# Patient Record
Sex: Female | Born: 1975 | Race: Black or African American | Hispanic: No | Marital: Married | Smoking: Never smoker
Health system: Southern US, Community
[De-identification: ages and names within clinical notes are randomized; demographics above are authoritative.]

---

## 2003-05-31 ENCOUNTER — Encounter: Admission: RE | Admit: 2003-05-31 | Discharge: 2003-05-31 | Payer: Self-pay | Admitting: Family Medicine

## 2003-06-05 ENCOUNTER — Encounter: Admission: RE | Admit: 2003-06-05 | Discharge: 2003-06-05 | Payer: Self-pay | Admitting: Family Medicine

## 2003-06-19 ENCOUNTER — Encounter: Admission: RE | Admit: 2003-06-19 | Discharge: 2003-06-19 | Payer: Self-pay | Admitting: Sports Medicine

## 2004-01-17 ENCOUNTER — Encounter: Admission: RE | Admit: 2004-01-17 | Discharge: 2004-01-17 | Payer: Self-pay | Admitting: Family Medicine

## 2004-06-05 ENCOUNTER — Emergency Department (HOSPITAL_COMMUNITY): Admission: EM | Admit: 2004-06-05 | Discharge: 2004-06-05 | Payer: Self-pay | Admitting: Emergency Medicine

## 2004-09-15 ENCOUNTER — Ambulatory Visit: Payer: Self-pay | Admitting: Family Medicine

## 2004-09-15 ENCOUNTER — Other Ambulatory Visit: Admission: RE | Admit: 2004-09-15 | Discharge: 2004-09-15 | Payer: Self-pay | Admitting: Family Medicine

## 2004-10-13 ENCOUNTER — Ambulatory Visit: Payer: Self-pay | Admitting: Family Medicine

## 2004-12-31 ENCOUNTER — Ambulatory Visit: Payer: Self-pay | Admitting: Family Medicine

## 2005-04-06 ENCOUNTER — Ambulatory Visit: Payer: Self-pay | Admitting: Family Medicine

## 2005-11-18 ENCOUNTER — Ambulatory Visit: Payer: Self-pay | Admitting: Family Medicine

## 2005-11-20 ENCOUNTER — Ambulatory Visit: Payer: Self-pay | Admitting: Family Medicine

## 2005-12-07 ENCOUNTER — Ambulatory Visit: Payer: Self-pay | Admitting: Sports Medicine

## 2006-02-22 ENCOUNTER — Ambulatory Visit: Payer: Self-pay | Admitting: Family Medicine

## 2006-06-16 ENCOUNTER — Encounter (INDEPENDENT_AMBULATORY_CARE_PROVIDER_SITE_OTHER): Payer: Self-pay | Admitting: *Deleted

## 2006-06-16 LAB — CONVERTED CEMR LAB

## 2006-06-22 ENCOUNTER — Ambulatory Visit: Payer: Self-pay | Admitting: Family Medicine

## 2006-07-02 ENCOUNTER — Other Ambulatory Visit: Admission: RE | Admit: 2006-07-02 | Discharge: 2006-07-02 | Payer: Self-pay | Admitting: Family Medicine

## 2006-07-02 ENCOUNTER — Ambulatory Visit: Payer: Self-pay | Admitting: Family Medicine

## 2006-07-16 ENCOUNTER — Ambulatory Visit: Payer: Self-pay | Admitting: Family Medicine

## 2006-07-16 ENCOUNTER — Ambulatory Visit (HOSPITAL_COMMUNITY): Admission: RE | Admit: 2006-07-16 | Discharge: 2006-07-16 | Payer: Self-pay | Admitting: Internal Medicine

## 2006-07-29 ENCOUNTER — Ambulatory Visit: Payer: Self-pay | Admitting: Family Medicine

## 2006-08-13 ENCOUNTER — Ambulatory Visit: Payer: Self-pay | Admitting: Family Medicine

## 2006-09-09 ENCOUNTER — Ambulatory Visit: Payer: Self-pay | Admitting: Family Medicine

## 2006-10-12 ENCOUNTER — Ambulatory Visit: Payer: Self-pay | Admitting: Family Medicine

## 2006-10-28 ENCOUNTER — Ambulatory Visit: Payer: Self-pay | Admitting: Family Medicine

## 2006-11-17 ENCOUNTER — Ambulatory Visit: Payer: Self-pay | Admitting: Sports Medicine

## 2006-11-17 ENCOUNTER — Encounter (INDEPENDENT_AMBULATORY_CARE_PROVIDER_SITE_OTHER): Payer: Self-pay | Admitting: *Deleted

## 2006-11-17 LAB — CONVERTED CEMR LAB
ALT: 11 units/L (ref 0–35)
AST: 13 units/L (ref 0–37)
Albumin: 3.5 g/dL (ref 3.5–5.2)
CO2: 22 meq/L (ref 19–32)
Glucose, Bld: 74 mg/dL (ref 70–99)
Potassium: 4.1 meq/L (ref 3.5–5.3)
Uric Acid, Serum: 4.2 mg/dL (ref 2.4–7.0)

## 2006-11-19 ENCOUNTER — Encounter (INDEPENDENT_AMBULATORY_CARE_PROVIDER_SITE_OTHER): Payer: Self-pay | Admitting: *Deleted

## 2006-11-19 LAB — CONVERTED CEMR LAB: Creatinine 24 HR UR: 2249 mg/24hr — ABNORMAL HIGH (ref 700–1800)

## 2006-11-22 ENCOUNTER — Ambulatory Visit (HOSPITAL_COMMUNITY): Admission: RE | Admit: 2006-11-22 | Discharge: 2006-11-22 | Payer: Self-pay | Admitting: Obstetrics and Gynecology

## 2006-11-23 ENCOUNTER — Ambulatory Visit: Payer: Self-pay | Admitting: Family Medicine

## 2006-11-29 ENCOUNTER — Encounter (INDEPENDENT_AMBULATORY_CARE_PROVIDER_SITE_OTHER): Payer: Self-pay | Admitting: *Deleted

## 2006-11-29 ENCOUNTER — Ambulatory Visit: Payer: Self-pay | Admitting: Family Medicine

## 2006-12-03 ENCOUNTER — Ambulatory Visit: Payer: Self-pay | Admitting: Family Medicine

## 2006-12-06 ENCOUNTER — Ambulatory Visit: Payer: Self-pay | Admitting: Obstetrics & Gynecology

## 2006-12-07 ENCOUNTER — Ambulatory Visit: Payer: Self-pay | Admitting: Obstetrics and Gynecology

## 2006-12-07 ENCOUNTER — Inpatient Hospital Stay (HOSPITAL_COMMUNITY): Admission: AD | Admit: 2006-12-07 | Discharge: 2006-12-07 | Payer: Self-pay | Admitting: *Deleted

## 2006-12-09 ENCOUNTER — Ambulatory Visit: Payer: Self-pay | Admitting: Obstetrics & Gynecology

## 2006-12-09 ENCOUNTER — Ambulatory Visit: Payer: Self-pay | Admitting: Family Medicine

## 2006-12-13 ENCOUNTER — Ambulatory Visit: Payer: Self-pay | Admitting: Obstetrics & Gynecology

## 2006-12-14 ENCOUNTER — Inpatient Hospital Stay (HOSPITAL_COMMUNITY): Admission: AD | Admit: 2006-12-14 | Discharge: 2006-12-17 | Payer: Self-pay | Admitting: Family Medicine

## 2006-12-14 ENCOUNTER — Ambulatory Visit: Payer: Self-pay | Admitting: *Deleted

## 2006-12-15 ENCOUNTER — Encounter (INDEPENDENT_AMBULATORY_CARE_PROVIDER_SITE_OTHER): Payer: Self-pay | Admitting: *Deleted

## 2006-12-24 ENCOUNTER — Ambulatory Visit: Payer: Self-pay | Admitting: Family Medicine

## 2007-01-13 DIAGNOSIS — I10 Essential (primary) hypertension: Secondary | ICD-10-CM

## 2007-01-13 DIAGNOSIS — E669 Obesity, unspecified: Secondary | ICD-10-CM | POA: Insufficient documentation

## 2007-01-13 DIAGNOSIS — R011 Cardiac murmur, unspecified: Secondary | ICD-10-CM | POA: Insufficient documentation

## 2007-01-14 ENCOUNTER — Encounter (INDEPENDENT_AMBULATORY_CARE_PROVIDER_SITE_OTHER): Payer: Self-pay | Admitting: *Deleted

## 2007-01-26 ENCOUNTER — Telehealth: Payer: Self-pay | Admitting: *Deleted

## 2007-03-10 ENCOUNTER — Telehealth: Payer: Self-pay | Admitting: *Deleted

## 2007-07-11 ENCOUNTER — Encounter: Payer: Self-pay | Admitting: Family Medicine

## 2007-07-11 ENCOUNTER — Other Ambulatory Visit: Admission: RE | Admit: 2007-07-11 | Discharge: 2007-07-11 | Payer: Self-pay | Admitting: Family Medicine

## 2007-07-11 ENCOUNTER — Encounter (INDEPENDENT_AMBULATORY_CARE_PROVIDER_SITE_OTHER): Payer: Self-pay | Admitting: *Deleted

## 2007-07-11 ENCOUNTER — Ambulatory Visit: Payer: Self-pay | Admitting: Sports Medicine

## 2007-07-11 LAB — CONVERTED CEMR LAB: Pap Smear: NORMAL

## 2007-07-12 LAB — CONVERTED CEMR LAB
Cholesterol: 168 mg/dL (ref 0–200)
GC Probe Amp, Genital: NEGATIVE
LDL Cholesterol: 110 mg/dL — ABNORMAL HIGH (ref 0–99)
VLDL: 13 mg/dL (ref 0–40)

## 2007-07-15 ENCOUNTER — Encounter (INDEPENDENT_AMBULATORY_CARE_PROVIDER_SITE_OTHER): Payer: Self-pay | Admitting: *Deleted

## 2007-09-01 ENCOUNTER — Ambulatory Visit: Payer: Self-pay | Admitting: Internal Medicine

## 2007-09-01 ENCOUNTER — Telehealth (INDEPENDENT_AMBULATORY_CARE_PROVIDER_SITE_OTHER): Payer: Self-pay | Admitting: *Deleted

## 2007-09-01 ENCOUNTER — Encounter (INDEPENDENT_AMBULATORY_CARE_PROVIDER_SITE_OTHER): Payer: Self-pay | Admitting: *Deleted

## 2008-04-23 ENCOUNTER — Encounter: Payer: Self-pay | Admitting: Family Medicine

## 2008-07-02 ENCOUNTER — Ambulatory Visit: Payer: Self-pay | Admitting: Family Medicine

## 2008-07-03 ENCOUNTER — Encounter: Payer: Self-pay | Admitting: Family Medicine

## 2008-07-05 ENCOUNTER — Telehealth (INDEPENDENT_AMBULATORY_CARE_PROVIDER_SITE_OTHER): Payer: Self-pay | Admitting: Family Medicine

## 2008-07-05 ENCOUNTER — Encounter (INDEPENDENT_AMBULATORY_CARE_PROVIDER_SITE_OTHER): Payer: Self-pay | Admitting: *Deleted

## 2009-05-28 ENCOUNTER — Encounter: Payer: Self-pay | Admitting: Family Medicine

## 2009-10-25 ENCOUNTER — Encounter: Payer: Self-pay | Admitting: Family Medicine

## 2009-10-25 ENCOUNTER — Ambulatory Visit: Payer: Self-pay | Admitting: Family Medicine

## 2009-10-25 ENCOUNTER — Other Ambulatory Visit: Admission: RE | Admit: 2009-10-25 | Discharge: 2009-10-25 | Payer: Self-pay | Admitting: Family Medicine

## 2009-11-04 ENCOUNTER — Encounter: Payer: Self-pay | Admitting: Family Medicine

## 2009-12-05 ENCOUNTER — Ambulatory Visit: Payer: Self-pay | Admitting: Family Medicine

## 2009-12-05 ENCOUNTER — Encounter: Payer: Self-pay | Admitting: Family Medicine

## 2009-12-05 DIAGNOSIS — R8789 Other abnormal findings in specimens from female genital organs: Secondary | ICD-10-CM

## 2009-12-16 ENCOUNTER — Encounter: Payer: Self-pay | Admitting: Family Medicine

## 2009-12-16 LAB — CONVERTED CEMR LAB

## 2010-08-26 ENCOUNTER — Ambulatory Visit: Payer: Self-pay | Admitting: Family Medicine

## 2010-09-01 ENCOUNTER — Telehealth: Payer: Self-pay | Admitting: Family Medicine

## 2010-09-02 ENCOUNTER — Encounter: Payer: Self-pay | Admitting: Family Medicine

## 2010-10-17 ENCOUNTER — Ambulatory Visit: Payer: Self-pay | Admitting: Family Medicine

## 2010-12-15 ENCOUNTER — Ambulatory Visit
Admission: RE | Admit: 2010-12-15 | Discharge: 2010-12-15 | Payer: Self-pay | Source: Home / Self Care | Attending: Family Medicine | Admitting: Family Medicine

## 2010-12-15 DIAGNOSIS — F438 Other reactions to severe stress: Secondary | ICD-10-CM | POA: Insufficient documentation

## 2010-12-16 NOTE — Letter (Signed)
Summary: COLPOSCOPY follow up: Letter  [facility]  [address]   [place], [place] [address]   Phone: [phone]  Fax: [phone]    12/16/2009  [facility] [address] [place], [place]  [address]  Dear Ms. GIBBS,  The extra testing we did at your colposcopy confirmed the low grade changes we saw on your pap. 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 This infection can pre-dispose you to abnormal pa smears and makes [place] want to follow you more closely.  Your colposcopy itself showed no lesions so as we had discussed, I would recommend you see [place] for a repeat pap smear in 6 months. We will probably be following you every 6 months for two years. If the abnormalities persist for two years, we would recommend a referral to a gynecologist for possible LEEP procedure.  If you have any questions, please call.   Your pap smear can be done here by your regular provider or in [facility] by me.         Sincerely,   Denny Levy MD  Appended Document: COLPOSCOPY follow up: Letter mailed letter to pt

## 2010-12-16 NOTE — Letter (Signed)
Summary: Generic Letter  [facility]  [address]   [place], [place] [address]   Phone: [phone]  Fax: [phone]    09/02/2010  Michelle Kline [address] [place], [place]  [address]  Dear [name],   Your PAP is still abnormal, it is a low grade abnormality.  Dr. Jennette Kettle recommended that you have PAP smears every 6 months until they are normal.  Anytime you have an infection that will affect the cells, so the Trich could be a factor; along with the others already discussed.  Please return in 6 months for repeat PAP and STD testing.  Your partner will need to be treated, the refrain form sex for one week after you both are treated.        Sincerely,   Luretha Murphy NP  Appended Document: Generic Letter mailed

## 2010-12-16 NOTE — Miscellaneous (Signed)
Summary: Consent Colposcopy  Consent Colposcopy   Imported By: Clydell Hakim 12/10/2009 16:20:10  _____________________________________________________________________  External Attachment:    Type:   Image     Comment:   External Document

## 2010-12-16 NOTE — Progress Notes (Signed)
Summary: re: pap/ts  Phone Note Outgoing Call   Call placed by: Haydee Monica Call placed to: Patient Details for Reason: results of PAP and treatment of trich. Summary of Call: Patient to return call Initial call taken by: Luretha Murphy NP,  September 01, 2010 11:51 AM  Follow-up for Phone Call        called pt and informed of Trich found on pap smear. pt NKDA.  pharmacy is [facility] on [address]. told the pt, that S.Humberto Seals would send meds. pt agreed and said, that she had Trich before. fwd. to [facility] Follow-up by: Arlyss Repress CMA,,  September 02, 2010 8:45 AM  Additional Follow-up for Phone Call Additional follow up Details #1::        letter sent for repeat pap in 6 months and std treatment discussed by Ramond Dial with patient. Additional Follow-up by: Luretha Murphy NP,  September 02, 2010 11:50 AM    New/Updated Medications: METRONIDAZOLE 500 MG TABS (METRONIDAZOLE) 4 pills once, please counsel on alcohol affect Prescriptions: METRONIDAZOLE 500 MG TABS (METRONIDAZOLE) 4 pills once, please counsel on alcohol affect  #4 x 0   Entered and Authorized by:   Luretha Murphy NP   Signed by:   Luretha Murphy NP on 09/02/2010   Method used:   Electronically to        [facility] (retail)       [address]       [place], [place]  [id number]       Ph: [phone]       Fax: [phone]   RxID:   [id number]

## 2010-12-16 NOTE — Assessment & Plan Note (Signed)
Summary: repeat pap,df   Vital Signs:  Patient profile:   36 year old female Height:      66.75 inches Weight:      210 pounds BMI:     33.26 Temp:     97.7 degrees F oral Pulse rate:   90 / minute BP sitting:   136 / 84  (left arm) Cuff size:   large  Vitals Entered By: Tessie Fass CMA (August 26, 2010 9:39 AM) CC: repeat pap Is Patient Diabetic? No Pain Assessment Patient in pain? no        Primary Care Provider:  Lequita Asal  MD  CC:  repeat pap.  History of Present Illness: Here for repeat PAP after culpo over 6 months ago, patient has no complaints  Habits & Providers  Alcohol-Tobacco-Diet     Tobacco Status: never  Current Medications (verified): 1)  None  Allergies (verified): No Known Drug Allergies  Physical Exam  General:  Well-developed,well-nourished,in no acute distress; alert,appropriate and cooperative throughout examination Genitalia:  Normal introitus for age, no external lesions, no vaginal discharge, mucosa pink and moist, no vaginal or cervical lesions, no vaginal atrophy, no friaility or hemorrhage, normal uterus size and position, no adnexal masses or tenderness   Impression & Recommendations:  Problem # 1:  SCREENING FOR MALIGNANT NEOPLASM OF THE CERVIX (ICD-V76.2) PAP today, if abdnormal will refer   Problem # 2:  ABNORMAL PAP SMEAR, 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

## 2010-12-16 NOTE — Assessment & Plan Note (Signed)
Summary: flu shot,df  Nurse Visit  Vital Signs:  Patient profile:   35 year old female Temp:     97.8 degrees F  Vitals Entered By: Theresia Lo RN (October 17, 2010 11:11 AM)  Allergies: No Known Drug Allergies  Immunizations Administered:  Influenza Vaccine # 1:    Vaccine Type: Fluvax 3+    Site: right deltoid    Mfr: GlaxoSmithKline    Dose: 0.5 ml    Route: IM    Given by: Theresia Lo RN    Exp. Date: 05/16/2011    Lot #: [id number]    VIS given: 06/10/10 version given October 17, 2010.  Flu Vaccine Consent Questions:    Do you have a history of severe allergic reactions to this vaccine? no    Any prior history of allergic reactions to egg and/or gelatin? no    Do you have a sensitivity to the preservative Thimersol? no    Do you have a past history of Guillan-Barre Syndrome? no    Do you currently have an acute febrile illness? no    Have you ever had a severe reaction to latex? no    Vaccine information given and explained to patient? yes    Are you currently pregnant? no  Orders Added: 1)  Flu Vaccine 36yrs + [[address]] 2)  Admin 1st Vaccine [[address]782]

## 2010-12-16 NOTE — Assessment & Plan Note (Signed)
Summary: colpo,tcb   Vital Signs:  Patient profile:   35 year old female Height:      66.75 inches Weight:      248.1 pounds BMI:     39.29 Temp:     98.1 degrees F oral Pulse rate:   75 / minute BP sitting:   144 / 83 Cuff size:   large  Vitals Entered By: Gladstone Pih (December 05, 2009 10:40 AM) CC: Colpo Is Patient Diabetic? No Pain Assessment Patient in pain? no        Primary Care Provider:  Lequita Asal  MD  CC:  Colpo.  History of Present Illness: LGSIL on pap  10/25/09,   Habits & Providers  Alcohol-Tobacco-Diet     Tobacco Status: never  Allergies: No Known Drug Allergies  Physical Exam  Additional Exam:  Patient given informed consent, signed copy in the chart. Placed in lithotomy position. Cervix viewed with speculum and colposcope. Was the entire squamocolumnar junction seen?with use of endocervical speculum the entie SCJ was seen. Any acetowhite lesions noted?no Any abnormalities seen with green filter? Any abnormalities seen with application of Lugol's solution?not done Was the endocervical canal sampled?yes as additional pap smear for HPV Were any cervical biopsies taken?no Were there any complications?no COMMENTS:vaginal side wall retractirs used. WILL SEND EPEAT PAP FOR hpv IF any ABNL Patient was given post procedure instructions. We will notify her of any results.    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 You had an abnormal pap but the colposcop was normal. In your age group and with he results of the initial pap I would recommend a repeat pap in 6 months. 2)  Please schedule that at your convenience with either Dr Lanier Prude or me (Dr Jennette Kettle).

## 2010-12-18 ENCOUNTER — Telehealth: Payer: Self-pay | Admitting: Family Medicine

## 2010-12-22 ENCOUNTER — Telehealth: Payer: Self-pay | Admitting: *Deleted

## 2010-12-24 NOTE — Progress Notes (Signed)
Summary: phn msg  Phone Note Call from Patient Call back at [phone]   Caller: Patient Summary of Call: needs complete paperwork asap - she is jeapardy of losing her job - all of it needs to be completely Initial call taken by: De Nurse,  December 18, 2010 3:13 PM  Follow-up for Phone Call        I completed all the paperwork, it was in the fax cubby hole.  Thank you Follow-up by: Edd Arbour,  December 18, 2010 3:40 PM

## 2010-12-24 NOTE — Assessment & Plan Note (Signed)
Summary: anxiety/depression/eo   Vital Signs:  Patient profile:   35 year old female Height:      66.75 inches Weight:      220 pounds Temp:     99 degrees F oral Pulse rate:   80 / minute Pulse rhythm:   regular BP sitting:   159 / 90  (left arm) Cuff size:   large  Vitals Entered By: Loralee Pacas CMA (December 15, 2010 3:31 PM) CC: anxiety/depression   Primary Provider:  Lequita Asal  MD  CC:  anxiety/depression.  History of Present Illness: 1. Depression/anxiety  patient has a PHQ9 score of 21. she has been experiencing lack of interest, fatigue, weight gain, crying spells. she is normally the rock of her family, but lately she is having trouble keeping up. She is seen by a psychotherapist who recommended some leave from work to try lifestyle modification like excercise and relaxation to try and combat this new mood change. She is working more now and has no one to help her with her children at home, which is a major stressor for her. She is not on any medications that would cause depression. She has no signs/symptoms of hypothyroidism. She has no suicidal/homicidal thoughts.  Habits & Providers  Alcohol-Tobacco-Diet     Tobacco Status: never     Tobacco Counseling: not indicated; no tobacco use  Allergies: No Known Drug Allergies  Review of Systems       see HPI  Physical Exam  Psych:  Oriented X3, normally interactive, good eye contact, flat affect, subdued, withdrawn, and slightly anxious.     Impression & Recommendations:  Problem # 1:  DYSPHORIA (ICD-309.89) Assessment New  I believe her symptoms are related to real life stressors and less of an organic depressive disorder. I will perscribe a two week hiatus from work for her to get her life in order. She will see me again if she is still feeling depressed. She will also follow up with her psychotherapist.  Orders: [facility]- Est Level  3 ([address])  Complete Medication List: 1)  Metronidazole 500  Mg Tabs (Metronidazole) .... 4 pills once, please counsel on alcohol affect   Orders Added: 1)  [facility]- Est Level  3 [[address]]

## 2010-12-25 ENCOUNTER — Encounter: Payer: Self-pay | Admitting: *Deleted

## 2011-01-01 NOTE — Progress Notes (Signed)
Summary: Records  Phone Note Call from Patient Call back at [phone]   Summary of Call: pt sts some of her records were faxed over from a different office, said Archie Patten had them the last time she was here, pt is needing a copy of these.  Initial call taken by: Knox Royalty,  December 22, 2010 9:39 AM  Follow-up for Phone Call        lvm for pt to return call Follow-up by: Loralee Pacas CMA,  December 22, 2010 10:16 AM  Additional Follow-up for Phone Call Additional follow up Details #1::        spoke with pt and told her that dr. Rivka Safer has this paperwork and i am unsure whether or not he has it in his box pt expressed to me that she has to fax this along with other information to her ins co. for disability claim or she could lose her job. will flag dr. Rivka Safer  Additional Follow-up by: Loralee Pacas CMA,  December 22, 2010 10:19 AM    Additional Follow-up for Phone Call Additional follow up Details #2::    information gathered for pt she will come by and pick up Follow-up by: Loralee Pacas CMA,  December 22, 2010 3:32 PM

## 2011-01-08 ENCOUNTER — Encounter: Payer: Self-pay | Admitting: Family Medicine

## 2011-03-06 ENCOUNTER — Ambulatory Visit: Payer: Self-pay | Admitting: Family Medicine

## 2011-04-03 NOTE — Op Note (Signed)
Michelle Kline, Michelle Kline                 ACCOUNT NO.:  [id number]   MEDICAL RECORD NO.:  [id number]          PATIENT TYPE:  INP   LOCATION:  9120                          FACILITY:  [facility]   PHYSICIAN:  Ginger Carne, MD  DATE OF BIRTH:  1976-11-12   DATE OF PROCEDURE:  12/15/2006  DATE OF DISCHARGE:                               OPERATIVE REPORT   PREOPERATIVE DIAGNOSIS:  Desires permanent sterilization.   POSTOPERATIVE DIAGNOSIS:  Desires permanent sterilization.   PROCEDURE:  Postpartum tubal ligation via Pomeroy method.   SURGEON:  Ginger Carne, MD   ASSISTANT:  Paticia Stack, MD   ANESTHESIA:  Epidural and local.   COMPLICATIONS:  None.   ESTIMATED BLOOD LOSS:  Minimal.   INDICATIONS:  This is a 35 year old gravida 4, para 3-0-1-3, status post  spontaneous vaginal delivery, who desires permanent sterilization.  The  risks and benefits of the procedure were discussed with the patient,  including risk of failure and increased risk of ectopic gestation if  pregnancy occurs.   PROCEDURE:  The patient was taken to the operating room, where her  epidural was found to be adequate.  A small vertical infraumbilical skin  incision was then made with a scalpel and the incision was carried down  to the underlying layer of fascia until the peritoneum was identified  and entered.  The peritoneum was noted to be free of any adhesions and  the incision was extended with the Metzenbaum scissors.  The patient's  right fallopian tube was then identified, brought to the incision, and  grasped with a Babcock clamp.  The tube was followed out to its fimbriae  and isolated from its individual round ligament.  The Babcock clamp was  then used to grasp the tube approximately 4 cm from the cornual region.  A 3 cm segment of tube was then ligated with a free tie of plain gut and  excised.  The ends were electrocauterized with excellent hemostasis  noted.  The left fallopian tube was then  ligated and a 3 cm segment  excised in a similar fashion.  Excellent hemostasis was noted and the  tube returned to the abdomen.  The fascia was then closed in a single  layer using 3-0 Vicryl.  The skin was closed in a subcuticular  fashion with 3-0 Monocryl on a PS-2 needle.  The patient tolerated the  procedure well.  Sponge, lap and needle counts were correct x2.  The  patient was taken to the recovery room in stable condition.   PATHOLOGY:  Segments of the right and left fallopian tubes were sent to  pathology.     ______________________________  Paticia Stack, MD      Ginger Carne, MD  Electronically Signed    LNJ/MEDQ  D:  12/15/2006  T:  12/15/2006  Job:  [id number]

## 2011-04-06 ENCOUNTER — Ambulatory Visit: Payer: Self-pay | Admitting: Family Medicine

## 2012-01-07 ENCOUNTER — Ambulatory Visit: Payer: Self-pay | Admitting: Family Medicine

## 2012-01-12 ENCOUNTER — Other Ambulatory Visit (HOSPITAL_COMMUNITY)
Admission: RE | Admit: 2012-01-12 | Discharge: 2012-01-12 | Disposition: A | Discharge status: Home / Self Care | Payer: Medicare HMO | Source: Ambulatory Visit | Attending: Family Medicine | Admitting: Family Medicine

## 2012-01-12 ENCOUNTER — Ambulatory Visit (INDEPENDENT_AMBULATORY_CARE_PROVIDER_SITE_OTHER): Payer: Medicare HMO | Admitting: Family Medicine

## 2012-01-12 ENCOUNTER — Encounter: Payer: Self-pay | Admitting: Family Medicine

## 2012-01-12 VITALS — BP 148/90 | HR 76 | Temp 97.9°F | Ht 66.0 in | Wt 234.8 lb

## 2012-01-12 DIAGNOSIS — R8789 Other abnormal findings in specimens from female genital organs: Secondary | ICD-10-CM

## 2012-01-12 DIAGNOSIS — R030 Elevated blood-pressure reading, without diagnosis of hypertension: Secondary | ICD-10-CM

## 2012-01-12 DIAGNOSIS — B977 Papillomavirus as the cause of diseases classified elsewhere: Secondary | ICD-10-CM

## 2012-01-12 DIAGNOSIS — Z124 Encounter for screening for malignant neoplasm of cervix: Secondary | ICD-10-CM

## 2012-01-12 DIAGNOSIS — Z01419 Encounter for gynecological examination (general) (routine) without abnormal findings: Secondary | ICD-10-CM | POA: Insufficient documentation

## 2012-01-12 NOTE — Patient Instructions (Signed)
It was great to see you today!  Schedule an appointment to see me for your yearly physical.

## 2012-01-12 NOTE — Progress Notes (Signed)
  Subjective:    Patient ID: Michelle Kline, female    DOB: May 12, 1976, 36 y.o.   MRN: [id number]  HPI 1. Pap smear performed. - HX of irregular PAP results. Denies bleeding, discharge, pain itching. She is sexually active.  2. Mild HTN No dm. No symptoms. No BP meds. BP Readings from Last 3 Encounters:  01/12/12 148/90  12/15/10 159/90  08/26/10 136/84    Review of Systems Pertinent items are noted in HPI. No fever, chills, night sweats, weight loss.     Objective:   Physical Exam Filed Vitals:   01/12/12 1113  BP: 148/90  Pulse: 76  Temp: 97.9 F (36.6 C)  TempSrc: Oral  Height: 5\' 6"  (1.676 m)  Weight: 234 lb 12.8 oz (106.505 kg)  Vagina: white thin discharge (normal appearing) no foul smell. External genitalia normal. Cervix grossly normal appearing. Heart - Regular rate and rhythm.  No murmurs, gallops or rubs.    Lungs:  Normal respiratory effort, chest expands symmetrically. Lungs are clear to auscultation, no crackles or wheezes.     Assessment & Plan:

## 2012-01-12 NOTE — Assessment & Plan Note (Signed)
BP Readings from Last 3 Encounters:  01/12/12 148/90  12/15/10 159/90  08/26/10 136/84  advised lifestyle changes. Diet and exercise. No DM hx.  Will follow up her BP.

## 2012-01-12 NOTE — Assessment & Plan Note (Signed)
Here for repeat PAP.

## 2012-02-02 ENCOUNTER — Telehealth: Payer: Self-pay | Admitting: Family Medicine

## 2012-02-02 NOTE — Telephone Encounter (Signed)
Hasn't heard anything about results of pap.  pls advise.

## 2012-02-02 NOTE — Telephone Encounter (Signed)
Called and informed pt of results:  Diagnosis NEGATIVE FOR INTRAEPITHELIAL LESIONS OR MALIGNANCY.  Pt to rtc in 1 year. Laureen Ochs, Viann Shove

## 2013-01-27 ENCOUNTER — Encounter (HOSPITAL_COMMUNITY): Payer: Self-pay | Admitting: Adult Health

## 2013-01-27 ENCOUNTER — Emergency Department (HOSPITAL_COMMUNITY): Payer: Self-pay

## 2013-01-27 ENCOUNTER — Emergency Department (HOSPITAL_COMMUNITY)
Admission: EM | Admit: 2013-01-27 | Discharge: 2013-01-27 | Disposition: A | Discharge status: Home / Self Care | Payer: Self-pay | Attending: Emergency Medicine | Admitting: Emergency Medicine

## 2013-01-27 DIAGNOSIS — J3489 Other specified disorders of nose and nasal sinuses: Secondary | ICD-10-CM | POA: Insufficient documentation

## 2013-01-27 DIAGNOSIS — J189 Pneumonia, unspecified organism: Secondary | ICD-10-CM

## 2013-01-27 DIAGNOSIS — R071 Chest pain on breathing: Secondary | ICD-10-CM | POA: Insufficient documentation

## 2013-01-27 DIAGNOSIS — J159 Unspecified bacterial pneumonia: Secondary | ICD-10-CM | POA: Insufficient documentation

## 2013-01-27 MED ORDER — HYDROCOD POLST-CHLORPHEN POLST 10-8 MG/5ML PO LQCR
5.0000 mL | Freq: Once | ORAL | Status: AC
  Administered 2013-01-27: 5 mL via ORAL
  Filled 2013-01-27: qty 5

## 2013-01-27 MED ORDER — AZITHROMYCIN 250 MG PO TABS
500.0000 mg | ORAL_TABLET | Freq: Once | ORAL | Status: AC
  Administered 2013-01-27: 500 mg via ORAL
  Filled 2013-01-27: qty 2

## 2013-01-27 MED ORDER — HYDROCOD POLST-CHLORPHEN POLST 10-8 MG/5ML PO LQCR: 5.0000 mL | Freq: Two times a day (BID) | ORAL | Status: DC | PRN

## 2013-01-27 MED ORDER — BENZONATATE 200 MG PO CAPS: 200.0000 mg | ORAL_CAPSULE | Freq: Three times a day (TID) | ORAL | Status: DC | PRN

## 2013-01-27 MED ORDER — BENZONATATE 100 MG PO CAPS
200.0000 mg | ORAL_CAPSULE | Freq: Three times a day (TID) | ORAL | Status: DC | PRN
  Administered 2013-01-27: 200 mg via ORAL
  Filled 2013-01-27: qty 2

## 2013-01-27 MED ORDER — AZITHROMYCIN 250 MG PO TABS: ORAL_TABLET | ORAL | Status: DC

## 2013-01-27 NOTE — ED Provider Notes (Signed)
History     CSN: [id number]  Arrival date & time 01/27/13  0029   First MD Initiated Contact with Patient 01/27/13 0044      Chief Complaint  Patient presents with  . Cough    (Consider location/radiation/quality/duration/timing/severity/associated sxs/prior treatment) HPI 37 yo female presents to the ER with complaint of cough x 10 days with chest wall pain associated with the coughing.  No fevers, no chills.  Pt reports cough is productive of yellow/brown sputum.  Pt is a nonsmoker, no h/o asthma.  Pt tried otc med for congestion x1 without effect.  History reviewed. No pertinent past medical history.  History reviewed. No pertinent past surgical history.  History reviewed. No pertinent family history.  History  Substance Use Topics  . Smoking status: Never Smoker   . Smokeless tobacco: Not on file  . Alcohol Use: No    OB History   Grav Para Term Preterm Abortions TAB SAB Ect Mult Living                  Review of Systems  All other systems reviewed and are negative.    Allergies  Review of patient's allergies indicates no known allergies.  Home Medications   Current Outpatient Rx  Name  Route  Sig  Dispense  Refill  . ibuprofen (ADVIL,MOTRIN) 200 MG tablet   Oral   Take 200 mg by mouth every 6 (six) hours as needed for pain.         Marland Kitchen azithromycin (ZITHROMAX) 250 MG tablet      Take one tab each evening for 4 days   4 each   0   . benzonatate (TESSALON) 200 MG capsule   Oral   Take 1 capsule (200 mg total) by mouth 3 (three) times daily as needed for cough.   20 capsule   0   . chlorpheniramine-HYDROcodone (TUSSIONEX) 10-8 MG/5ML LQCR   Oral   Take 5 mLs by mouth every 12 (twelve) hours as needed (cough).   140 mL   0     BP 187/99  Temp(Src) 98.2 F (36.8 C) (Oral)  Resp 18  SpO2 98%  LMP 01/25/2013  Physical Exam  Nursing note and vitals reviewed. Constitutional: She is oriented to person, place, and time. She appears  well-developed and well-nourished.  HENT:  Head: Normocephalic and atraumatic.  Nose: Nose normal.  Mouth/Throat: Oropharynx is clear and moist.  Eyes: Conjunctivae and EOM are normal. Pupils are equal, round, and reactive to light.  Neck: Normal range of motion. Neck supple. No JVD present. No tracheal deviation present. No thyromegaly present.  Cardiovascular: Normal rate, regular rhythm, normal heart sounds and intact distal pulses.  Exam reveals no gallop and no friction rub.   No murmur heard. Pulmonary/Chest: Effort normal and breath sounds normal. No stridor. No respiratory distress. She has no wheezes. She has no rales. She exhibits no tenderness.  Abdominal: Soft. Bowel sounds are normal. She exhibits no distension and no mass. There is no tenderness. There is no rebound and no guarding.  Musculoskeletal: Normal range of motion. She exhibits no edema and no tenderness.  Lymphadenopathy:    She has no cervical adenopathy.  Neurological: She is alert and oriented to person, place, and time. She exhibits normal muscle tone. Coordination normal.  Skin: Skin is warm and dry. No rash noted. No erythema. No pallor.  Psychiatric: She has a normal mood and affect. Her behavior is normal. Judgment and thought content normal.  ED Course  Procedures (including critical care time)  Labs Reviewed - No data to display Dg Chest 2 View  01/27/2013  *RADIOLOGY REPORT*  Clinical Data: Productive cough for 1 week, congestion, cold symptoms  CHEST - 2 VIEW  Comparison: None  Findings: Upper normal heart size. Mediastinal contours and pulmonary vascularity normal. Right lower lobe infiltrate in anterior basal segment consistent with pneumonia. Remaining lungs clear. No pleural effusion or pneumothorax. Bones unremarkable.  IMPRESSION: Right lower lobe pneumonia.   Original Report Authenticated By: Ulyses Southward, M.D.      1. CAP (community acquired pneumonia)       MDM  37 yo female with  infiltrate on cxr.  Will treat with zithro, cough medications.        Olivia Mackie, MD 01/27/13 [id number]

## 2013-01-27 NOTE — ED Notes (Addendum)
Presents with 10 days of cough reports pain in chest when coughing, cough is productive, brownish yellowish in color and associated with runny nose.  Denies SOB. Bilateral breath sounds clear. Denies fevers.  Pt denies pain.  Pt is hypertensive 187/99.

## 2013-02-02 ENCOUNTER — Ambulatory Visit (INDEPENDENT_AMBULATORY_CARE_PROVIDER_SITE_OTHER): Payer: Self-pay | Admitting: Family Medicine

## 2013-02-02 ENCOUNTER — Encounter: Payer: Self-pay | Admitting: Family Medicine

## 2013-02-02 VITALS — BP 156/83 | HR 93 | Temp 98.0°F | Ht 66.0 in | Wt 239.2 lb

## 2013-02-02 DIAGNOSIS — J189 Pneumonia, unspecified organism: Secondary | ICD-10-CM

## 2013-02-02 DIAGNOSIS — R030 Elevated blood-pressure reading, without diagnosis of hypertension: Secondary | ICD-10-CM

## 2013-02-02 NOTE — Progress Notes (Signed)
  Subjective:    Patient ID: Michelle Kline, female    DOB: 21-Nov-1975, 37 y.o.   MRN: [id number]  HPI  1. followup pneumonia. Patient seen in the emergency department March 14, diagnosed with [facility] pneumonia after 10 days of cough and fatigue. X-ray findings showed right lower lobe pneumonia. She denies a white count or systemic illness. She completed azithromycin treatment. States she feels much improved with only a rare cough. There was a sick coworker she thinks expose her to the illness.  She denies any fever, shortness of breath, wheezing, malaise, rash, chest pain, fatigue. She is exposed to secondhand smoke sometimes. Denies history of pneumonia or asthma. Did not get flu shot this year.  2. elevated blood pressure. Patient has documented blood pressure elevation several times over the past year. She is not formally diagnosed with hypertension or receiving treatment. She has not followed up this due to lack of insurance, though she recently filed for this through her job. She denies any headache, edema, visual changes. No family history, alcohol, drug use, decongestant/recent NSAID use.  Review of Systems See HPI otherwise negative.  reports that she has never smoked. She does not have any smokeless tobacco history on file.     Objective:   Physical Exam  Vitals reviewed. Constitutional: She is oriented to person, place, and time. She appears well-developed and well-nourished. No distress.  HENT:  Head: Normocephalic and atraumatic.  Nose: Nose normal.  Mouth/Throat: Oropharynx is clear and moist. No oropharyngeal exudate.  Slight hoarseness noted.  Neck: Neck supple. No thyromegaly present.  Cardiovascular: Normal rate, regular rhythm and normal heart sounds.   No murmur heard. Pulmonary/Chest: Effort normal and breath sounds normal. No respiratory distress. She has no wheezes. She has no rales.  Musculoskeletal: She exhibits no edema and no tenderness.  Lymphadenopathy:   She has no cervical adenopathy.  Neurological: She is alert and oriented to person, place, and time.  Skin: She is not diaphoretic.  Psychiatric: She has a normal mood and affect.       Assessment & Plan:

## 2013-02-02 NOTE — Patient Instructions (Addendum)
Nice to meet you. If you have fevers, shortness of breath or worsening, then call the doctor for appointment. Make an appointment with Dr. Claiborne Billings in next 2-4 weeks to discuss high blood pressure.   Pneumonia, Adult Pneumonia is an infection of the lungs. It may be caused by a germ (virus or bacteria). Some types of pneumonia can spread easily from person to person. This can happen when you cough or sneeze. HOME CARE  Only take medicine as told by your doctor.  Take your medicine (antibiotics) as told. Finish it even if you start to feel better.  Do not smoke.  You may use a vaporizer or humidifier in your room. This can help loosen thick spit (mucus).  Sleep so you are almost sitting up (semi-upright). This helps reduce coughing.  Rest. A shot (vaccine) can help prevent pneumonia. Shots are often advised for:  People over 28 years old.  Patients on chemotherapy.  People with long-term (chronic) lung problems.  People with immune system problems. GET HELP RIGHT AWAY IF:   You are getting worse.  You cannot control your cough, and you are losing sleep.  You cough up blood.  Your pain gets worse, even with medicine.  You have a fever.  Any of your problems are getting worse, not better.  You have shortness of breath or chest pain. MAKE SURE YOU:   Understand these instructions.  Will watch your condition.  Will get help right away if you are not doing well or get worse. Document Released: 04/20/2008 Document Revised: 01/25/2012 Document Reviewed: 01/23/2011 [facility] Patient Information 2013 [place], [place].     Hypertension Hypertension is another name for high blood pressure. High blood pressure may mean that your heart needs to work harder to pump blood. Blood pressure consists of two numbers, which includes a higher number over a lower number (example: 110/72). HOME CARE   Make lifestyle changes as told by your doctor. This may include weight loss and  exercise.  Take your blood pressure medicine every day.  Limit how much salt you use.  Stop smoking if you smoke.  Do not use drugs.  Talk to your doctor if you are using decongestants or birth control pills. These medicines might make blood pressure higher.  Females should not drink more than 1 alcoholic drink per day. Males should not drink more than 2 alcoholic drinks per day.  See your doctor as told. GET HELP RIGHT AWAY IF:   You have a blood pressure reading with a top number of 180 or higher.  You get a very bad headache.  You get blurred or changing vision.  You feel confused.  You feel weak, numb, or faint.  You get chest or belly (abdominal) pain.  You throw up (vomit).  You cannot breathe very well. MAKE SURE YOU:   Understand these instructions.  Will watch your condition.  Will get help right away if you are not doing well or get worse. Document Released: 04/20/2008 Document Revised: 01/25/2012 Document Reviewed: 04/20/2008 [facility] Patient Information 2013 [place], [place].

## 2013-02-02 NOTE — Assessment & Plan Note (Signed)
Clinically improved following azithromycin course. Will give flu shot today. May return to work. Advised patient to followup if symptoms return, fevers or shortness of breath. Advised avoidance of sake and smoke.

## 2013-02-02 NOTE — Assessment & Plan Note (Signed)
Leaning towards hypertension at this point. Advised her strongly to follow up with PCP to discuss treatment and further evaluation. Discussed risk factor of long-term untreated hypertension including renal, cardiac manifestations importance of followup she agrees to do so now that insurance is in place.

## 2013-02-06 ENCOUNTER — Telehealth: Payer: Self-pay | Admitting: Family Medicine

## 2013-02-06 NOTE — Telephone Encounter (Signed)
Patient is calling because her workplace need the letter to state that Michelle Kline is released to return to work, and it needs to have tomorrows date on it because she was sent home today because the letter she had didn't state that she was released.  Please call her when it is ready to be picked up.

## 2013-02-07 NOTE — Telephone Encounter (Signed)
Could you please give her the return to work excuse, with the proper dates? It appears she saw Noreene Larsson in the office.

## 2013-02-07 NOTE — Telephone Encounter (Signed)
Needs note to return to work tomorrow - not contagious

## 2013-02-07 NOTE — Telephone Encounter (Signed)
Left message on voicemail to rtn call,needing dates for letter . Michelle Kline, Michelle Kline

## 2013-02-07 NOTE — Telephone Encounter (Signed)
Pt informed that letter was placed upfront. Michelle Kline, Michelle Kline

## 2013-04-14 ENCOUNTER — Emergency Department (INDEPENDENT_AMBULATORY_CARE_PROVIDER_SITE_OTHER)
Admission: EM | Admit: 2013-04-14 | Discharge: 2013-04-14 | Disposition: A | Discharge status: Home / Self Care | Payer: 59 | Source: Home / Self Care | Attending: Emergency Medicine | Admitting: Emergency Medicine

## 2013-04-14 ENCOUNTER — Encounter (HOSPITAL_COMMUNITY): Payer: Self-pay | Admitting: Emergency Medicine

## 2013-04-14 DIAGNOSIS — H6123 Impacted cerumen, bilateral: Secondary | ICD-10-CM

## 2013-04-14 DIAGNOSIS — H6120 Impacted cerumen, unspecified ear: Secondary | ICD-10-CM

## 2013-04-14 DIAGNOSIS — H6092 Unspecified otitis externa, left ear: Secondary | ICD-10-CM

## 2013-04-14 MED ORDER — NEOMYCIN-POLYMYXIN-HC 3.5-10000-1 OT SUSP: 3.0000 [drp] | Freq: Four times a day (QID) | OTIC | Status: DC

## 2013-04-14 NOTE — ED Provider Notes (Signed)
Chief Complaint:   Chief Complaint  Patient presents with  . Cerumen Impaction    left ear ? clogged. "cant here out of left ear"    History of Present Illness:   Michelle Kline is a 37 year old female who has had pain in her left ear, sensation of congestion, and difficulty hearing. This has been going on for 3 or 4 days. She denies any pain or congestion of her right ear. She's had no fever, headache, nasal congestion, rhinorrhea, sore throat, or adenopathy. She denies a prior history of ear problems.  Review of Systems:  Other than noted above, the patient denies any of the following symptoms: Systemic:  No fevers, chills, sweats, weight loss or gain, fatigue, or tiredness. Eye:  No redness, pain, discharge, itching, blurred vision, or diplopia. ENT:  No headache, nasal congestion, sneezing, itching, epistaxis, ear pain, congestion, decreased hearing, ringing in ears, vertigo, or tinnitus.  No oral lesions, sore throat, pain on swallowing, or hoarseness. Neck:  No mass, tenderness or adenopathy. Lungs:  No coughing, wheezing, or shortness of breath. Skin:  No rash or itching.  PMFSH:  Past medical history, family history, social history, meds, and allergies were reviewed.   Physical Exam:   Vital signs:  BP 144/87  Pulse 71  Temp(Src) 97.3 F (36.3 C) (Oral)  Resp 18  SpO2 100%  LMP 03/17/2013 General:  Alert and oriented.  In no distress.  Skin warm and dry. Eye:  PERRL, full EOMs, lids and conjunctiva normal.   ENT:  There were cerumen impactions in both ear canals. These were irrigated clear. The right canal and TM are normal. The left canal was slightly erythematous but the TM was normal.  Nasal mucosa not congested and without drainage.  Mucous membranes moist, no oral lesions, normal dentition, pharynx clear.  No cranial or facial pain to palplation. Neck:  Supple, full ROM.  No adenopathy, tenderness or mass.  Thyroid normal. Lungs:  Breath sounds clear and equal bilaterally.   No wheezes, rales or rhonchi. Heart:  Rhythm regular, without extrasystoles.  No gallops or murmers. Skin:  Clear, warm and dry.  Assessment:  The primary encounter diagnosis was Cerumen impaction, bilateral. A diagnosis of Otitis externa, left was also pertinent to this visit.  She has bilateral cerumen impactions with some irritation of the left external ear canal secondary to cerumen. Thus we'll go ahead and treat with Cortisporin drops. Suggested Debrox drops for prevention of further ear wax impactions.   Plan:   1.  The following meds were prescribed:   Discharge Medication List as of 04/14/2013  5:58 PM    START taking these medications   Details  neomycin-polymyxin-hydrocortisone (CORTISPORIN) 3.5-10000-1 otic suspension Place 3 drops in ear(s) 4 (four) times daily., Starting 04/14/2013, Until Discontinued, Normal       2.  The patient was instructed in symptomatic care and handouts were given. 3.  The patient was told to return if becoming worse in any way, if no better in 3 or 4 days, and given some red flag symptoms such as increasing pain or difficulty hearing that would indicate earlier return. 4.  Follow up here if worse in any way.    Reuben Likes, MD 04/14/13 2053

## 2013-04-14 NOTE — ED Notes (Addendum)
Pt c/o left ear being clogged. Symptoms present x 1 wk. Pt is having some mild pain.  Denies any other symptoms.

## 2013-04-17 ENCOUNTER — Ambulatory Visit: Payer: Self-pay | Admitting: Family Medicine

## 2015-07-25 ENCOUNTER — Ambulatory Visit (INDEPENDENT_AMBULATORY_CARE_PROVIDER_SITE_OTHER): Payer: BLUE CROSS/BLUE SHIELD | Admitting: Family Medicine

## 2015-07-25 ENCOUNTER — Encounter: Payer: Self-pay | Admitting: Family Medicine

## 2015-07-25 VITALS — BP 188/98 | HR 77 | Temp 98.3°F | Ht 66.0 in | Wt 252.3 lb

## 2015-07-25 DIAGNOSIS — N926 Irregular menstruation, unspecified: Secondary | ICD-10-CM | POA: Insufficient documentation

## 2015-07-25 DIAGNOSIS — N912 Amenorrhea, unspecified: Secondary | ICD-10-CM | POA: Diagnosis not present

## 2015-07-25 DIAGNOSIS — R030 Elevated blood-pressure reading, without diagnosis of hypertension: Secondary | ICD-10-CM | POA: Diagnosis not present

## 2015-07-25 DIAGNOSIS — N910 Primary amenorrhea: Secondary | ICD-10-CM | POA: Diagnosis not present

## 2015-07-25 LAB — POCT URINE PREGNANCY: Preg Test, Ur: NEGATIVE

## 2015-07-25 NOTE — Patient Instructions (Addendum)
Let's give this a little more time before we do any big workup about your period. Schedule visit in 2 weeks for recheck of BP. Check your BP at home and bring in the readings  Be well, Dr. Ardelia Mems

## 2015-07-25 NOTE — Progress Notes (Signed)
Patient ID: Michelle Kline, female   DOB: 07-04-1976, 39 y.o.   MRN: [id number]  HPI:  Pt presents to discuss period being late. She is normally very regular in her periods but states it is 2 weeks late now. No recent stressors. No pelvic pain or vaginal discharge. Sexually active with two female partners in the last year. Had tubal ligation 8 years ago. Reports pap smear within the last year, [place] (recently moved back to [place]).  BP noted elevated today. Pt reports she has hx of it being elevated at doctors offices but is not elevated other times. Denies headaches or any issues related to high BP. Willing to get BP machine for home.  ROS: See HPI.  [place]: hx obesity, elevated BP  PHYSICAL EXAM: BP 188/98 mmHg  Pulse 77  Temp(Src) 98.3 F (36.8 C) (Oral)  Ht 5' 6"  (1.676 m)  Wt 252 lb 4.8 oz (114.443 kg)  BMI 40.74 kg/m2  LMP 06/15/2015 Gen: NAD, pleasant, cooperative Psych: normal range of affect, well groomed, speech normal in rate and volume, normal eye contact  Neuro: grossly nonfocal, speech normal  ASSESSMENT/PLAN:  ELEVATED BP READING WITHOUT DX HYPERTENSION BP elevated today, and again on recheck. Discussed with pt that BP is elevated enough that medication is warranted. She is convinced this is white coat HTN and prefers to recheck at home several times. She will obtain a BP machine and check BP at home, then f/u in 2 weeks with readings.  Late period Pregnancy test negative today. Suspect period being late by 2 weeks is due to a benign etiology. Offered pelvic exam to assess, but pt declined, saying she wanted to wait a little longer. She will f/u in 2 weeks for her elevated BP. If still no period by then, would perform pelvic exam and further workup as indicated.   FOLLOW UP: F/u in 2 weeks for BP & period  [place] J. Ardelia Mems, [place]

## 2015-07-25 NOTE — Assessment & Plan Note (Signed)
Pregnancy test negative today. Suspect period being late by 2 weeks is due to a benign etiology. Offered pelvic exam to assess, but pt declined, saying she wanted to wait a little longer. She will f/u in 2 weeks for her elevated BP. If still no period by then, would perform pelvic exam and further workup as indicated.

## 2015-07-25 NOTE — Assessment & Plan Note (Signed)
BP elevated today, and again on recheck. Discussed with pt that BP is elevated enough that medication is warranted. She is convinced this is white coat HTN and prefers to recheck at home several times. She will obtain a BP machine and check BP at home, then f/u in 2 weeks with readings.

## 2015-08-08 ENCOUNTER — Encounter: Payer: BLUE CROSS/BLUE SHIELD | Admitting: Family Medicine

## 2015-09-13 ENCOUNTER — Ambulatory Visit: Payer: BLUE CROSS/BLUE SHIELD | Admitting: Family Medicine

## 2015-11-17 DIAGNOSIS — I10 Essential (primary) hypertension: Secondary | ICD-10-CM

## 2015-11-17 HISTORY — DX: Essential (primary) hypertension: I10

## 2016-08-31 ENCOUNTER — Encounter: Payer: BLUE CROSS/BLUE SHIELD | Admitting: Student

## 2016-09-10 ENCOUNTER — Ambulatory Visit (INDEPENDENT_AMBULATORY_CARE_PROVIDER_SITE_OTHER): Payer: 59 | Admitting: Student

## 2016-09-10 ENCOUNTER — Encounter: Payer: Self-pay | Admitting: Student

## 2016-09-10 ENCOUNTER — Other Ambulatory Visit (HOSPITAL_COMMUNITY)
Admission: RE | Admit: 2016-09-10 | Discharge: 2016-09-10 | Disposition: A | Discharge status: Home / Self Care | Payer: 59 | Source: Ambulatory Visit | Attending: Family Medicine | Admitting: Family Medicine

## 2016-09-10 VITALS — BP 158/92 | HR 79 | Temp 98.1°F | Ht 66.0 in | Wt 221.0 lb

## 2016-09-10 DIAGNOSIS — Z01419 Encounter for gynecological examination (general) (routine) without abnormal findings: Secondary | ICD-10-CM | POA: Diagnosis present

## 2016-09-10 DIAGNOSIS — Z1151 Encounter for screening for human papillomavirus (HPV): Secondary | ICD-10-CM | POA: Insufficient documentation

## 2016-09-10 DIAGNOSIS — E6609 Other obesity due to excess calories: Secondary | ICD-10-CM

## 2016-09-10 DIAGNOSIS — I10 Essential (primary) hypertension: Secondary | ICD-10-CM | POA: Diagnosis not present

## 2016-09-10 DIAGNOSIS — Z124 Encounter for screening for malignant neoplasm of cervix: Secondary | ICD-10-CM | POA: Diagnosis not present

## 2016-09-10 DIAGNOSIS — Z0000 Encounter for general adult medical examination without abnormal findings: Secondary | ICD-10-CM

## 2016-09-10 DIAGNOSIS — Z6835 Body mass index (BMI) 35.0-35.9, adult: Secondary | ICD-10-CM

## 2016-09-10 DIAGNOSIS — Z23 Encounter for immunization: Secondary | ICD-10-CM

## 2016-09-10 MED ORDER — AMLODIPINE BESYLATE 5 MG PO TABS: 5.0000 mg | ORAL_TABLET | Freq: Every day | ORAL | 0 refills | Status: DC

## 2016-09-10 NOTE — Patient Instructions (Addendum)
It was great seeing you today! We have addressed the following issues today  1. Blood pressure: Blood pressure is 158/92. Your goal blood pressure is less than 140/90. I have sent a prescription for amlodipine 5 mg to your pharmacy. Please start taking this medication today. I recommend you come back and see [place] in 2 weeks. Keep up with exercise and diet!   If we did any lab work today, and the results require attention, either me or my nurse will get in touch with you. If everything is normal, you will get a letter in mail. If you don't hear from [place] in two weeks, please give [place] a call. Otherwise, we look forward to seeing you again at your next visit. If you have any questions or concerns before then, please call the clinic at [phone].   Please bring all your medications to every doctors visit   Sign up for My Chart to have easy access to your labs results, and communication with your Primary care physician.     Please check-out at the front desk before leaving the clinic.    Take Care,

## 2016-09-10 NOTE — Progress Notes (Signed)
Subjective:    Patient ID: Michelle Kline, female    DOB: 06-May-1976, 40 y.o.   MRN: [id number]  HPI Patient presents to the clinic for annual physical. She has no concerns today. PMH: HTN: checked her blood pressure at pharmacy and it was high but doesn't remember the numbers.  Weight (BMI): Has come down from 40.8 from 35.7 over the last one year. She has been walking/running 5-7 miles a day everyday at gym. She is also dieting. She takes milk replacement shake 190 cal for breakfast and lunch. She reports more snacks between breakfast and lunch, and lunch and dinner. She reports good amount of vegetable and salmon fish, about 600-calorie for dinner. She is very happy with the outcome and she is motivated to continue.   FH:  Heart Disease: mother with hypertension at 29 years of age. Father passed away in his 60s due to motor vehicle accident Cancer: Denies family history of breast or colon cancer  Psych/Social Depression: "good".  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 Exercise: See above Diet: See above Sexual activity: female. One.  Birth control: TBL  Screening Sleep Apnea Screen: denies snoring. Stated that she is fully refreshed when she wakes up in the morning. She denies daytime sleepiness or fatigue.  Opthalmology Visit:  hasn't been to an eye doctor but denies vision issues Dental Visit: two months ago STI: low risk. Next visit when we do blood work  Immunizations  Influenza: today  Tdap: today  Review of Systems  Constitutional: Negative.  Negative for appetite change, fever and unexpected weight change.  HENT: Negative for dental problem, hearing loss, sore throat and trouble swallowing.   Eyes: Negative for visual disturbance.  Respiratory: Negative for cough, chest tightness, shortness of breath and wheezing.   Cardiovascular: Negative for chest pain, palpitations and leg swelling.    Gastrointestinal: Negative for abdominal pain, blood in stool and constipation.  Endocrine: Negative for cold intolerance and heat intolerance.  Genitourinary: Negative for dyspareunia, dysuria, genital sores, hematuria and vaginal bleeding.  Musculoskeletal: Negative for arthralgias, joint swelling and myalgias.  Skin: Negative for rash.  Neurological: Negative for weakness, numbness and headaches.  Hematological: Negative for adenopathy. Does not bruise/bleed easily.  Psychiatric/Behavioral: Negative for dysphoric mood and sleep disturbance. The patient is not nervous/anxious.    Objective:   Physical Exam Vitals:   09/10/16 1343 09/10/16 1434  BP: (!) 166/78 (!) 158/92  Pulse: 79   Temp: 98.1 F (36.7 C)   Weight: 221 lb (100.2 kg)   Height: 5' 6"  (1.676 m)     GEN: appears well, no apparent distress. Head: normocephalic and atraumatic  Eyes: without conjunctival injection, sclera anicteric Ears: normal TM and ear canal,  Nares: negative for rhinorrhea, congestion or erythema,  Oropharynx: mmm without erythema or exudation HEM: Negative for cervical lymphadenopathy CVS: RRR, normal s1 and s2, 1/6 SEM over RUSB without radiation to her neck, no carotid or abdominal bruits, no edema RESP: no increased work of breathing, good air movement bilaterally, no crackles or wheeze GI: Bowel sounds present and normal, soft, non-tender,non-distended VZ:DGLOVFIE genitalia appears normal. No obvious lesion. Speculum and bimanual exam within normal limit. No cervical motion tenderness or adnexal mass MSK: No focal tenderness SKIN:No apparent skin lesion ENDO:Negative for thyromegaly NEURO: alert and oriented appropriately, no gross defecits  PSYCH: appropriate mood and affect     Assessment & Plan:  Essential hypertension Patient with elevated  blood pressures for long time. She hasn't been on medication so far. She lost about 32 pounds over the last 1 year through exercise and dieting.  She is a little bit frustrated that her blood pressure stayed up despite all these. We discussed about medication options and agreed to start amlodipine 5 mg daily. Patient to continue lifestyle change (exercise and diet). She will follow-up in 2 weeks.   Routine adult health maintenance Pap smear and flu vaccine today. I sent a prescription for Tdap to her pharmacy.  Obesity BMI down from 41 to 36 over the last 1 year through exercise and dieting. Congratulated patient on achievement and encouraged her to keep it up.

## 2016-09-11 DIAGNOSIS — Z0000 Encounter for general adult medical examination without abnormal findings: Secondary | ICD-10-CM | POA: Insufficient documentation

## 2016-09-11 DIAGNOSIS — Z0001 Encounter for general adult medical examination with abnormal findings: Secondary | ICD-10-CM | POA: Insufficient documentation

## 2016-09-11 LAB — CYTOLOGY - PAP
DIAGNOSIS: NEGATIVE
HPV: NOT DETECTED

## 2016-09-11 MED ORDER — TETANUS-DIPHTH-ACELL PERTUSSIS 5-2.5-18.5 LF-MCG/0.5 IM SUSP: 0.5000 mL | Freq: Once | INTRAMUSCULAR | 0 refills | Status: AC

## 2016-09-11 MED ORDER — TETANUS-DIPHTH-ACELL PERTUSSIS 5-2.5-18.5 LF-MCG/0.5 IM SUSP: 0.5000 mL | Freq: Once | INTRAMUSCULAR | 0 refills | Status: DC

## 2016-09-11 NOTE — Assessment & Plan Note (Signed)
BMI down from 41 to 36 over the last 1 year through exercise and dieting. Congratulated patient on achievement and encouraged her to keep it up.

## 2016-09-11 NOTE — Assessment & Plan Note (Addendum)
Pap smear and flu vaccine today. I sent a prescription for Tdap to her pharmacy.

## 2016-09-11 NOTE — Assessment & Plan Note (Signed)
Patient with elevated blood pressures for long time. She hasn't been on medication so far. She lost about 32 pounds over the last 1 year through exercise and dieting. She is a little bit frustrated that her blood pressure stayed up despite all these. We discussed about medication options and agreed to start amlodipine 5 mg daily. Patient to continue lifestyle change (exercise and diet). She will follow-up in 2 weeks.

## 2016-09-14 ENCOUNTER — Telehealth: Payer: Self-pay | Admitting: Student

## 2016-09-14 DIAGNOSIS — A599 Trichomoniasis, unspecified: Secondary | ICD-10-CM

## 2016-09-14 MED ORDER — METRONIDAZOLE 500 MG PO TABS: 2000.0000 mg | ORAL_TABLET | Freq: Once | ORAL | 0 refills | Status: AC

## 2016-09-14 NOTE — Telephone Encounter (Signed)
Called patient to discuss about the test results from her recent visit with me.  Left voicemail for a patient advising to call back. Pap smear normal but remarkable for trichomoniasis. This is a sexually transmitted infection that has tendency to show symptoms in female compared to males. It is advisable that patient and his sexual partner are treated. I also recommend testing for gonorrhea, chlamydia and HIV. Please ask patient if she wants to come in for treatment or she want me to send a prescription to the pharmacy. Her sexual partner has to see his primary care doctor for testing and treatment.

## 2016-09-14 NOTE — Telephone Encounter (Signed)
Patient called back in response to the voicemail left for her. I discussed about her results from her recent visits which was significant for trichomoniasis. She requested sending a prescription to her pharmacy. I also emphasized about the importance of partner treatment and testing for other STI's. She voiced understanding and appreciated the call. A prescription for metronidazole 2 g stat was sent to the pharmacy

## 2017-01-18 ENCOUNTER — Emergency Department (HOSPITAL_COMMUNITY)
Admission: EM | Admit: 2017-01-18 | Discharge: 2017-01-18 | Disposition: A | Discharge status: Home / Self Care | Payer: Self-pay | Attending: Emergency Medicine | Admitting: Emergency Medicine

## 2017-01-18 ENCOUNTER — Encounter (HOSPITAL_COMMUNITY): Payer: Self-pay | Admitting: *Deleted

## 2017-01-18 ENCOUNTER — Emergency Department (HOSPITAL_COMMUNITY): Payer: Self-pay

## 2017-01-18 DIAGNOSIS — R059 Cough, unspecified: Secondary | ICD-10-CM

## 2017-01-18 DIAGNOSIS — R05 Cough: Secondary | ICD-10-CM | POA: Insufficient documentation

## 2017-01-18 DIAGNOSIS — I10 Essential (primary) hypertension: Secondary | ICD-10-CM | POA: Insufficient documentation

## 2017-01-18 NOTE — ED Notes (Signed)
Pt going to xray  

## 2017-01-18 NOTE — ED Triage Notes (Signed)
Pt c/o non productive cough onset x 1 wk, denies other symptoms, A&O x4

## 2017-01-18 NOTE — ED Provider Notes (Signed)
[facility] DEPT Provider Note   CSN: [id number] Arrival date & time: 01/18/17  1154   By signing my name below, I, Hilbert Odor, attest that this documentation has been prepared under the direction and in the presence of Glendell Docker, NP. Electronically Signed: Hilbert Odor, Scribe. 01/18/17. 12:28 PM. History   Chief Complaint Chief Complaint  Patient presents with  . Cough    The history is provided by the patient. No language interpreter was used.  HPI Comments: Michelle Kline is a 41 y.o. female who presents to the Emergency Department complaining of a persistent dry cough for the past week. She also reports some chest pain last week but she believes that this is due to her persistent cough. She states that this feels similar to when she had PNA in the past. She has tried taking Mucinex and Nyquil with no significant relief. She denies fever and SOB.   History reviewed. No pertinent past medical history.  Patient Active Problem List   Diagnosis Date Noted  . Routine adult health maintenance 09/11/2016  . Obesity 01/13/2007  . MURMUR, FUNCTIONAL 01/13/2007  . Essential hypertension 01/13/2007    History reviewed. No pertinent surgical history.  OB History    No data available       Home Medications    Prior to Admission medications   Medication Sig Start Date End Date Taking? Authorizing Provider  amLODipine (NORVASC) 5 MG tablet Take 1 tablet (5 mg total) by mouth daily. 09/10/16   Mercy Riding, MD    Family History No family history on file.  Social History Social History  Substance Use Topics  . Smoking status: Never Smoker  . Smokeless tobacco: Never Used  . Alcohol use No     Allergies   Patient has no known allergies.   Review of Systems Review of Systems  Constitutional: Negative for chills and fever.  HENT: Negative for rhinorrhea and sore throat.   Respiratory: Positive for cough.   Cardiovascular: Positive for chest pain.  All  other systems reviewed and are negative.    Physical Exam Updated Vital Signs BP 164/94 (BP Location: Right Arm)   Pulse 88   Temp 98.1 F (36.7 C) (Oral)   Resp 14   Ht 5' 6"  (1.676 m)   Wt 218 lb 8 oz (99.1 kg)   LMP 01/18/2017 (Exact Date)   SpO2 100%   BMI 35.27 kg/m   Physical Exam  Constitutional: She is oriented to person, place, and time. She appears well-developed and well-nourished.  HENT:  Head: Normocephalic.  Right Ear: External ear normal.  Left Ear: External ear normal.  Mouth/Throat: Oropharynx is clear and moist.  Eyes: EOM are normal.  Neck: Normal range of motion.  Cardiovascular: Normal rate.   Pulmonary/Chest: Effort normal. No respiratory distress. She has no wheezes.  Abdominal: She exhibits no distension.  Musculoskeletal: Normal range of motion.  Neurological: She is alert and oriented to person, place, and time.  Psychiatric: She has a normal mood and affect.  Nursing note and vitals reviewed.    ED Treatments / Results  DIAGNOSTIC STUDIES: Oxygen Saturation is 100% on RA, normal by my interpretation.    COORDINATION OF CARE: 12:13 PM Discussed treatment plan with pt at bedside and pt agreed to plan. I will check the patient's CXR.  Labs (all labs ordered are listed, but only abnormal results are displayed) Labs Reviewed - No data to display  EKG  EKG Interpretation None  Radiology Dg Chest 2 View  Result Date: 01/18/2017 CLINICAL DATA:  41 y/o F; dry cough and chest congestion for 1 week. EXAM: CHEST  2 VIEW COMPARISON:  01/27/2013 chest radiograph FINDINGS: Stable heart size and mediastinal contours are within normal limits. Both lungs are clear. The visualized skeletal structures are unremarkable. IMPRESSION: No active cardiopulmonary disease. Electronically Signed   By: Kristine Garbe M.D.   On: 01/18/2017 13:33    Procedures Procedures (including critical care time)  Medications Ordered in ED Medications -  No data to display   Initial Impression / Assessment and Plan / ED Course  I have reviewed the triage vital signs and the nursing notes.  Pertinent labs & imaging results that were available during my care of the patient were reviewed by me and considered in my medical decision making (see chart for details).    No acute abnormality. Discussed follow up as needed  Final Clinical Impressions(s) / ED Diagnoses   Final diagnoses:  None    New Prescriptions New Prescriptions   No medications on file   I personally performed the services described in this documentation, which was scribed in my presence. The recorded information has been reviewed and is accurate.    Glendell Docker, NP 01/18/17 [place], MD 01/25/17 1616

## 2017-01-18 NOTE — Discharge Instructions (Signed)
Take something like claritin or allegra  for the symptoms

## 2017-07-13 ENCOUNTER — Other Ambulatory Visit: Payer: Self-pay | Admitting: *Deleted

## 2017-07-13 DIAGNOSIS — Z1231 Encounter for screening mammogram for malignant neoplasm of breast: Secondary | ICD-10-CM

## 2017-08-09 ENCOUNTER — Ambulatory Visit
Admission: RE | Admit: 2017-08-09 | Discharge: 2017-08-09 | Disposition: A | Discharge status: Home / Self Care | Payer: 59 | Source: Ambulatory Visit | Attending: *Deleted | Admitting: *Deleted

## 2017-08-09 DIAGNOSIS — Z1231 Encounter for screening mammogram for malignant neoplasm of breast: Secondary | ICD-10-CM

## 2017-08-11 ENCOUNTER — Other Ambulatory Visit: Payer: Self-pay | Admitting: *Deleted

## 2017-08-11 DIAGNOSIS — R928 Other abnormal and inconclusive findings on diagnostic imaging of breast: Secondary | ICD-10-CM

## 2017-08-18 ENCOUNTER — Ambulatory Visit
Admission: RE | Admit: 2017-08-18 | Discharge: 2017-08-18 | Disposition: A | Discharge status: Home / Self Care | Payer: 59 | Source: Ambulatory Visit | Attending: *Deleted | Admitting: *Deleted

## 2017-08-18 DIAGNOSIS — R928 Other abnormal and inconclusive findings on diagnostic imaging of breast: Secondary | ICD-10-CM

## 2018-09-01 ENCOUNTER — Other Ambulatory Visit: Payer: Self-pay | Admitting: *Deleted

## 2018-09-01 DIAGNOSIS — Z1231 Encounter for screening mammogram for malignant neoplasm of breast: Secondary | ICD-10-CM

## 2018-09-16 ENCOUNTER — Emergency Department (HOSPITAL_COMMUNITY)
Admission: EM | Admit: 2018-09-16 | Discharge: 2018-09-16 | Disposition: A | Discharge status: Home / Self Care | Payer: 59 | Attending: Emergency Medicine | Admitting: Emergency Medicine

## 2018-09-16 ENCOUNTER — Encounter (HOSPITAL_COMMUNITY): Payer: Self-pay | Admitting: Emergency Medicine

## 2018-09-16 DIAGNOSIS — Z79899 Other long term (current) drug therapy: Secondary | ICD-10-CM | POA: Diagnosis not present

## 2018-09-16 DIAGNOSIS — S39012A Strain of muscle, fascia and tendon of lower back, initial encounter: Secondary | ICD-10-CM | POA: Diagnosis not present

## 2018-09-16 DIAGNOSIS — S3992XA Unspecified injury of lower back, initial encounter: Secondary | ICD-10-CM | POA: Diagnosis present

## 2018-09-16 DIAGNOSIS — Y92410 Unspecified street and highway as the place of occurrence of the external cause: Secondary | ICD-10-CM | POA: Diagnosis not present

## 2018-09-16 DIAGNOSIS — M25562 Pain in left knee: Secondary | ICD-10-CM | POA: Insufficient documentation

## 2018-09-16 DIAGNOSIS — I10 Essential (primary) hypertension: Secondary | ICD-10-CM | POA: Diagnosis not present

## 2018-09-16 DIAGNOSIS — Y9389 Activity, other specified: Secondary | ICD-10-CM | POA: Insufficient documentation

## 2018-09-16 DIAGNOSIS — Y999 Unspecified external cause status: Secondary | ICD-10-CM | POA: Insufficient documentation

## 2018-09-16 MED ORDER — METHOCARBAMOL 500 MG PO TABS: 500.0000 mg | ORAL_TABLET | Freq: Every evening | ORAL | 0 refills | Status: DC | PRN
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
## 2018-09-16 NOTE — Discharge Instructions (Signed)
Take ibuprofen 3 times a day with meals.  Take 800 mg (4 pills) at a time. Do not take other anti-inflammatories at the same time (Advil, Motrin, naproxen, Aleve). You may supplement with Tylenol if you need further pain control. Use robaxin as needed for muscle stiffness or soreness.  Have caution, this may make you tired or groggy.  Do not drive or operate heavy machinery while taking this medicine. Use ice packs or heating pads if this helps control your pain. Use muscle creams (salonpas, icy hot, bengay) to help with pain.  You will likely have continued muscle stiffness and soreness over the next couple days.  Follow-up with primary care in 1 week if your symptoms are not improving. Return to the emergency room if you develop vision changes, vomiting, slurred speech, numbness, loss of bowel or bladder control, or any new or worsening symptoms.

## 2018-09-16 NOTE — ED Notes (Signed)
Patient verbalizes understanding of discharge instructions. Opportunity for questioning and answers were provided. Armband removed by staff, pt discharged from ED home via POV.  
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
## 2018-12-28 ENCOUNTER — Ambulatory Visit
Admission: RE | Admit: 2018-12-28 | Discharge: 2018-12-28 | Disposition: A | Discharge status: Home / Self Care | Payer: 59 | Source: Ambulatory Visit | Attending: *Deleted | Admitting: *Deleted

## 2018-12-28 DIAGNOSIS — Z1231 Encounter for screening mammogram for malignant neoplasm of breast: Secondary | ICD-10-CM

## 2020-02-12 LAB — HM PAP SMEAR

## 2020-02-12 LAB — HM MAMMOGRAPHY: HM Mammogram: NORMAL (ref 0–4)

## 2021-01-13 ENCOUNTER — Ambulatory Visit: Payer: No Typology Code available for payment source | Admitting: Internal Medicine

## 2021-01-20 ENCOUNTER — Other Ambulatory Visit: Payer: Self-pay | Admitting: *Deleted

## 2021-01-20 DIAGNOSIS — Z1231 Encounter for screening mammogram for malignant neoplasm of breast: Secondary | ICD-10-CM

## 2021-02-11 ENCOUNTER — Ambulatory Visit (INDEPENDENT_AMBULATORY_CARE_PROVIDER_SITE_OTHER): Payer: No Typology Code available for payment source | Admitting: Internal Medicine

## 2021-02-11 ENCOUNTER — Other Ambulatory Visit: Payer: Self-pay

## 2021-02-11 ENCOUNTER — Encounter: Payer: Self-pay | Admitting: Internal Medicine

## 2021-02-11 VITALS — BP 178/96 | HR 87 | Temp 98.3°F | Resp 16 | Ht 66.0 in | Wt 263.0 lb

## 2021-02-11 DIAGNOSIS — Z6841 Body Mass Index (BMI) 40.0 and over, adult: Secondary | ICD-10-CM

## 2021-02-11 DIAGNOSIS — I10 Essential (primary) hypertension: Secondary | ICD-10-CM | POA: Diagnosis not present

## 2021-02-11 DIAGNOSIS — D539 Nutritional anemia, unspecified: Secondary | ICD-10-CM | POA: Diagnosis not present

## 2021-02-11 DIAGNOSIS — E66813 Obesity, class 3: Secondary | ICD-10-CM

## 2021-02-11 DIAGNOSIS — Z0001 Encounter for general adult medical examination with abnormal findings: Secondary | ICD-10-CM

## 2021-02-11 DIAGNOSIS — Z0000 Encounter for general adult medical examination without abnormal findings: Secondary | ICD-10-CM | POA: Diagnosis not present

## 2021-02-11 DIAGNOSIS — E269 Hyperaldosteronism, unspecified: Secondary | ICD-10-CM | POA: Diagnosis not present

## 2021-02-11 DIAGNOSIS — Z23 Encounter for immunization: Secondary | ICD-10-CM

## 2021-02-11 MED ORDER — INSULIN PEN NEEDLE 32G X 6 MM MISC: 1.0000 | Freq: Every day | 1 refills | Status: DC

## 2021-02-11 MED ORDER — SAXENDA 18 MG/3ML ~~LOC~~ SOPN
3.0000 mg | PEN_INJECTOR | Freq: Every day | SUBCUTANEOUS | 1 refills | Status: DC
Start: 2021-02-11 — End: 2021-04-19

## 2021-02-11 NOTE — Patient Instructions (Addendum)
Directions for Saxenda: 0.6 mg daily for 1 week, 1.2 mg daily for 1 week, 1.8 mg daily for 1 week, 2.4 mg daily for 1 week, 3.0 mg daily. Maximum daily dose is 3.0 mg.    Hypertension, Adult High blood pressure (hypertension) is when the force of blood pumping through the arteries is too strong. The arteries are the blood vessels that carry blood from the heart throughout the body. Hypertension forces the heart to work harder to pump blood and may cause arteries to become narrow or stiff. Untreated or uncontrolled hypertension can cause a heart attack, heart failure, a stroke, kidney disease, and other problems. A blood pressure reading consists of a higher number over a lower number. Ideally, your blood pressure should be below 120/80. The first ("top") number is called the systolic pressure. It is a measure of the pressure in your arteries as your heart beats. The second ("bottom") number is called the diastolic pressure. It is a measure of the pressure in your arteries as the heart relaxes. What are the causes? The exact cause of this condition is not known. There are some conditions that result in or are related to high blood pressure. What increases the risk? Some risk factors for high blood pressure are under your control. The following factors may make you more likely to develop this condition:  Smoking.  Having type 2 diabetes mellitus, high cholesterol, or both.  Not getting enough exercise or physical activity.  Being overweight.  Having too much fat, sugar, calories, or salt (sodium) in your diet.  Drinking too much alcohol. Some risk factors for high blood pressure may be difficult or impossible to change. Some of these factors include:  Having chronic kidney disease.  Having a family history of high blood pressure.  Age. Risk increases with age.  Race. You may be at higher risk if you are African American.  Gender. Men are at higher risk than women before age 63. After  age 24, women are at higher risk than men.  Having obstructive sleep apnea.  Stress. What are the signs or symptoms? High blood pressure may not cause symptoms. Very high blood pressure (hypertensive crisis) may cause:  Headache.  Anxiety.  Shortness of breath.  Nosebleed.  Nausea and vomiting.  Vision changes.  Severe chest pain.  Seizures. How is this diagnosed? This condition is diagnosed by measuring your blood pressure while you are seated, with your arm resting on a flat surface, your legs uncrossed, and your feet flat on the floor. The cuff of the blood pressure monitor will be placed directly against the skin of your upper arm at the level of your heart. It should be measured at least twice using the same arm. Certain conditions can cause a difference in blood pressure between your right and left arms. Certain factors can cause blood pressure readings to be lower or higher than normal for a short period of time:  When your blood pressure is higher when you are in a health care provider's office than when you are at home, this is called white coat hypertension. Most people with this condition do not need medicines.  When your blood pressure is higher at home than when you are in a health care provider's office, this is called masked hypertension. Most people with this condition may need medicines to control blood pressure. If you have a high blood pressure reading during one visit or you have normal blood pressure with other risk factors, you may be  asked to:  Return on a different day to have your blood pressure checked again.  Monitor your blood pressure at home for 1 week or longer. If you are diagnosed with hypertension, you may have other blood or imaging tests to help your health care provider understand your overall risk for other conditions. How is this treated? This condition is treated by making healthy lifestyle changes, such as eating healthy foods, exercising  more, and reducing your alcohol intake. Your health care provider may prescribe medicine if lifestyle changes are not enough to get your blood pressure under control, and if:  Your systolic blood pressure is above 130.  Your diastolic blood pressure is above 80. Your personal target blood pressure may vary depending on your medical conditions, your age, and other factors. Follow these instructions at home: Eating and drinking  Eat a diet that is high in fiber and potassium, and low in sodium, added sugar, and fat. An example eating plan is called the DASH (Dietary Approaches to Stop Hypertension) diet. To eat this way: ? Eat plenty of fresh fruits and vegetables. Try to fill one half of your plate at each meal with fruits and vegetables. ? Eat whole grains, such as whole-wheat pasta, brown rice, or whole-grain bread. Fill about one fourth of your plate with whole grains. ? Eat or drink low-fat dairy products, such as skim milk or low-fat yogurt. ? Avoid fatty cuts of meat, processed or cured meats, and poultry with skin. Fill about one fourth of your plate with lean proteins, such as fish, chicken without skin, beans, eggs, or tofu. ? Avoid pre-made and processed foods. These tend to be higher in sodium, added sugar, and fat.  Reduce your daily sodium intake. Most people with hypertension should eat less than 1,500 mg of sodium a day.  Do not drink alcohol if: ? Your health care provider tells you not to drink. ? You are pregnant, may be pregnant, or are planning to become pregnant.  If you drink alcohol: ? Limit how much you use to:  0-1 drink a day for women.  0-2 drinks a day for men. ? Be aware of how much alcohol is in your drink. In the [place] one drink equals one 12 oz bottle of beer (355 mL), one 5 oz glass of wine (148 mL), or one 1 oz glass of hard liquor (44 mL).   Lifestyle  Work with your health care provider to maintain a healthy body weight or to lose weight. Ask what an  ideal weight is for you.  Get at least 30 minutes of exercise most days of the week. Activities may include walking, swimming, or biking.  Include exercise to strengthen your muscles (resistance exercise), such as Pilates or lifting weights, as part of your weekly exercise routine. Try to do these types of exercises for 30 minutes at least 3 days a week.  Do not use any products that contain nicotine or tobacco, such as cigarettes, e-cigarettes, and chewing tobacco. If you need help quitting, ask your health care provider.  Monitor your blood pressure at home as told by your health care provider.  Keep all follow-up visits as told by your health care provider. This is important.   Medicines  Take over-the-counter and prescription medicines only as told by your health care provider. Follow directions carefully. Blood pressure medicines must be taken as prescribed.  Do not skip doses of blood pressure medicine. Doing this puts you at risk for problems and can  make the medicine less effective.  Ask your health care provider about side effects or reactions to medicines that you should watch for. Contact a health care provider if you:  Think you are having a reaction to a medicine you are taking.  Have headaches that keep coming back (recurring).  Feel dizzy.  Have swelling in your ankles.  Have trouble with your vision. Get help right away if you:  Develop a severe headache or confusion.  Have unusual weakness or numbness.  Feel faint.  Have severe pain in your chest or abdomen.  Vomit repeatedly.  Have trouble breathing. Summary  Hypertension is when the force of blood pumping through your arteries is too strong. If this condition is not controlled, it may put you at risk for serious complications.  Your personal target blood pressure may vary depending on your medical conditions, your age, and other factors. For most people, a normal blood pressure is less than  120/80.  Hypertension is treated with lifestyle changes, medicines, or a combination of both. Lifestyle changes include losing weight, eating a healthy, low-sodium diet, exercising more, and limiting alcohol. This information is not intended to replace advice given to you by your health care provider. Make sure you discuss any questions you have with your health care provider. Document Revised: 07/13/2018 Document Reviewed: 07/13/2018 Elsevier Patient Education  2021 [employer].

## 2021-02-11 NOTE — Progress Notes (Signed)
Subjective:  Patient ID: Michelle Kline, female    DOB: 07/14/1976  Age: 45 y.o. MRN: [id number]  CC: Annual Exam and Hypertension  This visit occurred during the SARS-CoV-2 public health emergency.  Safety protocols were in place, including screening questions prior to the visit, additional usage of staff PPE, and extensive cleaning of exam room while observing appropriate contact time as indicated for disinfecting solutions.    HPI [name] presents for a CPX and to establish.  She has had hypertension for 3 years.  She has been trying to control it with hydrochlorothiazide.  She walks several miles a couple times a week and does not experience CP, DOE, palpitations, edema, or fatigue.  She complains of weight gain.  History Genese has a past medical history of Hypertension (2017).   She has no past surgical history on file.   Her family history includes Breast cancer in her paternal aunt and paternal aunt; Hypertension in her brother, mother, and sister.She reports that she has never smoked. She has never used smokeless tobacco. She reports that she does not drink alcohol and does not use drugs.  Outpatient Medications Prior to Visit  Medication Sig Dispense Refill  . hydrochlorothiazide (HYDRODIURIL) 25 MG tablet Take 25 mg by mouth daily.    Marland Kitchen amLODipine (NORVASC) 5 MG tablet Take 1 tablet (5 mg total) by mouth daily. 90 tablet 0  . methocarbamol (ROBAXIN) 500 MG tablet Take 1 tablet (500 mg total) by mouth at bedtime as needed for muscle spasms. 5 tablet 0   No facility-administered medications prior to visit.    ROS Review of Systems  Constitutional: Positive for unexpected weight change. Negative for chills, diaphoresis, fatigue and fever.  Respiratory: Negative for apnea, cough, shortness of breath and wheezing.   Cardiovascular: Negative for chest pain, palpitations and leg swelling.  Gastrointestinal: Negative for abdominal pain, constipation, diarrhea, nausea and  vomiting.  Endocrine: Negative.   Genitourinary: Negative.  Negative for difficulty urinating.  Musculoskeletal: Negative for arthralgias, joint swelling and myalgias.  Skin: Negative.   Neurological: Negative.  Negative for dizziness, weakness, light-headedness, numbness and headaches.  Hematological: Negative for adenopathy. Does not bruise/bleed easily.  Psychiatric/Behavioral: Negative.     Objective:  BP (!) 178/96 (BP Location: Right Arm, Patient Position: Sitting, Cuff Size: Large)   Pulse 87   Temp 98.3 F (36.8 C) (Oral)   Resp 16   Ht 5' 6"  (1.676 m)   Wt 263 lb (119.3 kg)   LMP 01/22/2021 Comment: BTL  SpO2 98%   BMI 42.45 kg/m   Physical Exam Vitals reviewed.  Constitutional:      General: She is not in acute distress.    Appearance: She is obese. She is not toxic-appearing or diaphoretic.  HENT:     Nose: Nose normal.     Mouth/Throat:     Mouth: Mucous membranes are moist.  Eyes:     General: No scleral icterus.    Conjunctiva/sclera: Conjunctivae normal.  Cardiovascular:     Rate and Rhythm: Normal rate and regular rhythm.     Heart sounds: Normal heart sounds, S1 normal and S2 normal. No murmur heard. No gallop.      Comments: EKG- NSR, 71 bpm No LVH Normal EKG Pulmonary:     Effort: Pulmonary effort is normal.     Breath sounds: No stridor. No wheezing, rhonchi or rales.  Abdominal:     General: Abdomen is protuberant. Bowel sounds are normal. There is no distension.  Palpations: Abdomen is soft. There is no hepatomegaly, splenomegaly or mass.     Tenderness: There is no abdominal tenderness.     Hernia: No hernia is present.  Musculoskeletal:        General: No swelling. Normal range of motion.     Cervical back: Neck supple.     Right lower leg: No edema.     Left lower leg: No edema.  Lymphadenopathy:     Cervical: No cervical adenopathy.  Skin:    General: Skin is warm and dry.     Coloration: Skin is not pale.  Neurological:      General: No focal deficit present.     Mental Status: She is alert.  Psychiatric:        Mood and Affect: Mood normal.        Behavior: Behavior normal.      Lab Results  Component Value Date   WBC 6.8 02/11/2021   HGB 11.3 (L) 02/11/2021   HCT 34.3 (L) 02/11/2021   PLT 291.0 02/11/2021   GLUCOSE 88 02/11/2021   CHOL 168 02/11/2021   TRIG 66.0 02/11/2021   HDL 55.90 02/11/2021   LDLCALC 99 02/11/2021   ALT 13 02/11/2021   AST 17 02/11/2021   NA 139 02/11/2021   K 3.4 (L) 02/11/2021   CL 103 02/11/2021   CREATININE 0.99 02/11/2021   BUN 15 02/11/2021   CO2 28 02/11/2021   TSH 2.00 02/11/2021   HGBA1C 5.5 02/11/2021    Assessment & Plan:   Justyn was seen today for annual exam and hypertension.  Diagnoses and all orders for this visit:  Essential hypertension- Her blood pressure is not adequately well controlled on hydrochlorothiazide.  She is hypokalemic.  I recommended that she improve her lifestyle modifications and upgrade to the combination of hydrochlorothiazide/triamterene/CCB.  Her EKG is negative for LVH. -     EKG 12-Lead -     CBC with Differential/Platelet; Future -     Basic metabolic panel; Future -     Urinalysis, Routine w reflex microscopic; Future -     TSH; Future -     Hepatic function panel; Future -     VITAMIN D 25 Hydroxy (Vit-D Deficiency, Fractures); Future -     Aldosterone + renin activity w/ ratio; Future -     Aldosterone + renin activity w/ ratio -     VITAMIN D 25 Hydroxy (Vit-D Deficiency, Fractures) -     Hepatic function panel -     TSH -     Urinalysis, Routine w reflex microscopic -     Basic metabolic panel -     CBC with Differential/Platelet -     triamterene-hydrochlorothiazide (DYAZIDE) 37.5-25 MG capsule; Take 1 each (1 capsule total) by mouth daily. -     amLODipine (NORVASC) 5 MG tablet; Take 1 tablet (5 mg total) by mouth daily.  Need for prophylactic vaccination with combined diphtheria-tetanus-pertussis (DTP)  vaccine -     Tdap vaccine greater than or equal to 7yo IM  Class 3 severe obesity due to excess calories with serious comorbidity and body mass index (BMI) of 40.0 to 44.9 in adult [facility])- Will start treating this with a high-dose GLP-1 agonist. -     Liraglutide -Weight Management (SAXENDA) 18 MG/3ML SOPN; Inject 3 mg into the skin daily. -     Insulin Pen Needle 32G X 6 MM MISC; 1 Act by Does not apply route daily. -  Hemoglobin A1c; Future -     VITAMIN D 25 Hydroxy (Vit-D Deficiency, Fractures); Future -     VITAMIN D 25 Hydroxy (Vit-D Deficiency, Fractures) -     Hemoglobin A1c  Encounter for general adult medical examination with abnormal findings- Exam completed, labs reviewed - statin therapy is not indicated, vaccines reviewed and updated, cancer screenings addressed - she has a mammogram scheduled within the next month, patient education material was given. -     Lipid panel; Future -     Hepatitis C antibody; Future -     HIV Antibody (routine testing w rflx); Future -     HIV Antibody (routine testing w rflx) -     Hepatitis C antibody -     Lipid panel  Deficiency anemia- I have asked her to return to be screened for vitamin deficiencies.   I have discontinued Whittney Gibbs's amLODipine, methocarbamol, and hydrochlorothiazide. I am also having her start on Saxenda, Insulin Pen Needle, triamterene-hydrochlorothiazide, and amLODipine.  Meds ordered this encounter  Medications  . Liraglutide -Weight Management (SAXENDA) 18 MG/3ML SOPN    Sig: Inject 3 mg into the skin daily.    Dispense:  9 mL    Refill:  1  . Insulin Pen Needle 32G X 6 MM MISC    Sig: 1 Act by Does not apply route daily.    Dispense:  100 each    Refill:  1  . triamterene-hydrochlorothiazide (DYAZIDE) 37.5-25 MG capsule    Sig: Take 1 each (1 capsule total) by mouth daily.    Dispense:  90 capsule    Refill:  0  . amLODipine (NORVASC) 5 MG tablet    Sig: Take 1 tablet (5 mg total) by mouth daily.     Dispense:  90 tablet    Refill:  0     Follow-up: Return in about 6 weeks (around 03/25/2021).  Scarlette Calico, MD

## 2021-02-12 ENCOUNTER — Telehealth: Payer: Self-pay

## 2021-02-12 LAB — CBC WITH DIFFERENTIAL/PLATELET
Basophils Absolute: 0.1 10*3/uL (ref 0.0–0.1)
Basophils Relative: 0.9 % (ref 0.0–3.0)
Eosinophils Absolute: 0.1 10*3/uL (ref 0.0–0.7)
Eosinophils Relative: 1.3 % (ref 0.0–5.0)
HCT: 34.3 % — ABNORMAL LOW (ref 36.0–46.0)
Hemoglobin: 11.3 g/dL — ABNORMAL LOW (ref 12.0–15.0)
Lymphocytes Relative: 26.7 % (ref 12.0–46.0)
Lymphs Abs: 1.8 10*3/uL (ref 0.7–4.0)
MCHC: 33 g/dL (ref 30.0–36.0)
MCV: 83.7 fl (ref 78.0–100.0)
Monocytes Absolute: 0.8 10*3/uL (ref 0.1–1.0)
Monocytes Relative: 11.8 % (ref 3.0–12.0)
Neutro Abs: 4 10*3/uL (ref 1.4–7.7)
Neutrophils Relative %: 59.3 % (ref 43.0–77.0)
Platelets: 291 10*3/uL (ref 150.0–400.0)
RBC: 4.1 Mil/uL (ref 3.87–5.11)
RDW: 14.4 % (ref 11.5–15.5)
WBC: 6.8 10*3/uL (ref 4.0–10.5)

## 2021-02-12 LAB — URINALYSIS, ROUTINE W REFLEX MICROSCOPIC
Bilirubin Urine: NEGATIVE
Hgb urine dipstick: NEGATIVE
Ketones, ur: NEGATIVE
Leukocytes,Ua: NEGATIVE
Nitrite: NEGATIVE
Specific Gravity, Urine: 1.025 (ref 1.000–1.030)
Total Protein, Urine: NEGATIVE
Urine Glucose: NEGATIVE
Urobilinogen, UA: 0.2 (ref 0.0–1.0)
pH: 6 (ref 5.0–8.0)

## 2021-02-12 LAB — BASIC METABOLIC PANEL
BUN: 15 mg/dL (ref 6–23)
CO2: 28 mEq/L (ref 19–32)
Calcium: 9.7 mg/dL (ref 8.4–10.5)
Chloride: 103 mEq/L (ref 96–112)
Creatinine, Ser: 0.99 mg/dL (ref 0.40–1.20)
GFR: 69.48 mL/min (ref >60.00)
Glucose, Bld: 88 mg/dL (ref 70–99)
Potassium: 3.4 mEq/L — ABNORMAL LOW (ref 3.5–5.1)
Sodium: 139 mEq/L (ref 135–145)

## 2021-02-12 LAB — HEPATIC FUNCTION PANEL
ALT: 13 U/L (ref 0–35)
AST: 17 U/L (ref 0–37)
Albumin: 4.3 g/dL (ref 3.5–5.2)
Alkaline Phosphatase: 48 U/L (ref 39–117)
Bilirubin, Direct: 0.1 mg/dL (ref 0.0–0.3)
Total Bilirubin: 0.5 mg/dL (ref 0.2–1.2)
Total Protein: 7.4 g/dL (ref 6.0–8.3)

## 2021-02-12 LAB — LIPID PANEL
Cholesterol: 168 mg/dL (ref 0–200)
HDL: 55.9 mg/dL (ref >39.00)
LDL Cholesterol: 99 mg/dL (ref 0–99)
NonHDL: 111.79
Total CHOL/HDL Ratio: 3
Triglycerides: 66 mg/dL (ref 0.0–149.0)
VLDL: 13.2 mg/dL (ref 0.0–40.0)

## 2021-02-12 LAB — HEMOGLOBIN A1C: Hgb A1c MFr Bld: 5.5 % (ref 4.6–6.5)

## 2021-02-12 LAB — VITAMIN D 25 HYDROXY (VIT D DEFICIENCY, FRACTURES): VITD: 62 ng/mL (ref 30.00–100.00)

## 2021-02-12 LAB — TSH: TSH: 2 u[IU]/mL (ref 0.35–4.50)

## 2021-02-12 MED ORDER — AMLODIPINE BESYLATE 5 MG PO TABS: 5.0000 mg | ORAL_TABLET | Freq: Every day | ORAL | 0 refills | Status: DC

## 2021-02-12 MED ORDER — TRIAMTERENE-HCTZ 37.5-25 MG PO CAPS: 1.0000 | ORAL_CAPSULE | Freq: Every day | ORAL | 0 refills | Status: DC

## 2021-02-12 NOTE — Telephone Encounter (Addendum)
Key: [id number]

## 2021-02-15 DIAGNOSIS — D539 Nutritional anemia, unspecified: Secondary | ICD-10-CM | POA: Insufficient documentation

## 2021-02-17 LAB — ALDOSTERONE + RENIN ACTIVITY W/ RATIO
ALDO / PRA Ratio: 33.3 Ratio — ABNORMAL HIGH (ref 0.9–28.9)
Aldosterone: 20 ng/dL
Renin Activity: 0.6 ng/mL/h (ref 0.25–5.82)

## 2021-02-17 LAB — HEPATITIS C ANTIBODY
Hepatitis C Ab: NONREACTIVE
SIGNAL TO CUT-OFF: 0.02 (ref <1.00)

## 2021-02-17 LAB — HIV ANTIBODY (ROUTINE TESTING W REFLEX): HIV 1&2 Ab, 4th Generation: NONREACTIVE

## 2021-02-18 ENCOUNTER — Encounter: Payer: Self-pay | Admitting: Internal Medicine

## 2021-02-20 NOTE — Telephone Encounter (Signed)
Original PA was denied. I incorrectly answered a question.   I have initiated a new PA with Key: [id number].

## 2021-02-24 ENCOUNTER — Encounter: Payer: Self-pay | Admitting: Internal Medicine

## 2021-02-25 ENCOUNTER — Encounter: Payer: Self-pay | Admitting: Internal Medicine

## 2021-02-25 ENCOUNTER — Other Ambulatory Visit: Payer: Self-pay | Admitting: Internal Medicine

## 2021-02-25 DIAGNOSIS — E269 Hyperaldosteronism, unspecified: Secondary | ICD-10-CM | POA: Insufficient documentation

## 2021-02-25 MED ORDER — SPIRONOLACTONE 25 MG PO TABS: 25.0000 mg | ORAL_TABLET | Freq: Every day | ORAL | 0 refills | Status: DC

## 2021-02-25 NOTE — Addendum Note (Signed)
Addended by: Janith Lima on: 02/25/2021 12:36 PM   Modules accepted: Orders

## 2021-03-04 ENCOUNTER — Other Ambulatory Visit: Payer: Self-pay

## 2021-03-04 ENCOUNTER — Ambulatory Visit
Admission: RE | Admit: 2021-03-04 | Discharge: 2021-03-04 | Disposition: A | Discharge status: Home / Self Care | Payer: No Typology Code available for payment source | Source: Ambulatory Visit

## 2021-03-04 DIAGNOSIS — Z1231 Encounter for screening mammogram for malignant neoplasm of breast: Secondary | ICD-10-CM

## 2021-03-05 ENCOUNTER — Other Ambulatory Visit: Payer: Self-pay | Admitting: *Deleted

## 2021-03-05 DIAGNOSIS — R928 Other abnormal and inconclusive findings on diagnostic imaging of breast: Secondary | ICD-10-CM

## 2021-03-24 ENCOUNTER — Other Ambulatory Visit: Payer: Self-pay

## 2021-03-25 ENCOUNTER — Ambulatory Visit (INDEPENDENT_AMBULATORY_CARE_PROVIDER_SITE_OTHER): Payer: No Typology Code available for payment source | Admitting: Internal Medicine

## 2021-03-25 ENCOUNTER — Other Ambulatory Visit: Payer: Self-pay

## 2021-03-25 ENCOUNTER — Encounter: Payer: Self-pay | Admitting: Internal Medicine

## 2021-03-25 VITALS — BP 138/88 | HR 93 | Temp 98.3°F | Resp 16 | Ht 66.0 in | Wt 255.0 lb

## 2021-03-25 DIAGNOSIS — D539 Nutritional anemia, unspecified: Secondary | ICD-10-CM | POA: Diagnosis not present

## 2021-03-25 DIAGNOSIS — D500 Iron deficiency anemia secondary to blood loss (chronic): Secondary | ICD-10-CM

## 2021-03-25 DIAGNOSIS — I10 Essential (primary) hypertension: Secondary | ICD-10-CM | POA: Diagnosis not present

## 2021-03-25 DIAGNOSIS — E269 Hyperaldosteronism, unspecified: Secondary | ICD-10-CM | POA: Diagnosis not present

## 2021-03-25 LAB — CBC WITH DIFFERENTIAL/PLATELET
Basophils Absolute: 0 10*3/uL (ref 0.0–0.1)
Basophils Relative: 0.8 % (ref 0.0–3.0)
Eosinophils Absolute: 0 10*3/uL (ref 0.0–0.7)
Eosinophils Relative: 0.7 % (ref 0.0–5.0)
HCT: 35.2 % — ABNORMAL LOW (ref 36.0–46.0)
Hemoglobin: 11.5 g/dL — ABNORMAL LOW (ref 12.0–15.0)
Lymphocytes Relative: 31 % (ref 12.0–46.0)
Lymphs Abs: 1.7 10*3/uL (ref 0.7–4.0)
MCHC: 32.7 g/dL (ref 30.0–36.0)
MCV: 85 fl (ref 78.0–100.0)
Monocytes Absolute: 0.5 10*3/uL (ref 0.1–1.0)
Monocytes Relative: 9.6 % (ref 3.0–12.0)
Neutro Abs: 3.1 10*3/uL (ref 1.4–7.7)
Neutrophils Relative %: 57.9 % (ref 43.0–77.0)
Platelets: 323 10*3/uL (ref 150.0–400.0)
RBC: 4.14 Mil/uL (ref 3.87–5.11)
RDW: 15.2 % (ref 11.5–15.5)
WBC: 5.4 10*3/uL (ref 4.0–10.5)

## 2021-03-25 LAB — BASIC METABOLIC PANEL
BUN: 12 mg/dL (ref 6–23)
CO2: 29 mEq/L (ref 19–32)
Calcium: 9.2 mg/dL (ref 8.4–10.5)
Chloride: 104 mEq/L (ref 96–112)
Creatinine, Ser: 0.92 mg/dL (ref 0.40–1.20)
GFR: 75.82 mL/min (ref >60.00)
Glucose, Bld: 88 mg/dL (ref 70–99)
Potassium: 3.7 mEq/L (ref 3.5–5.1)
Sodium: 138 mEq/L (ref 135–145)

## 2021-03-25 LAB — VITAMIN B12: Vitamin B-12: 379 pg/mL (ref 211–911)

## 2021-03-25 LAB — IRON: Iron: 31 ug/dL — ABNORMAL LOW (ref 42–145)

## 2021-03-25 LAB — RETICULOCYTES
ABS Retic: 58520 cells/uL (ref 20000–80000)
Retic Ct Pct: 1.4 %

## 2021-03-25 LAB — FOLATE: Folate: 21.9 ng/mL (ref >5.9)

## 2021-03-25 LAB — FERRITIN: Ferritin: 11.2 ng/mL (ref 10.0–291.0)

## 2021-03-25 NOTE — Patient Instructions (Signed)

## 2021-03-25 NOTE — Progress Notes (Signed)
Subjective:  Patient ID: Michelle Kline, female    DOB: Sep 01, 1976  Age: 45 y.o. MRN: [id number]  CC: Anemia and Hypertension  This visit occurred during the SARS-CoV-2 public health emergency.  Safety protocols were in place, including screening questions prior to the visit, additional usage of staff PPE, and extensive cleaning of exam room while observing appropriate contact time as indicated for disinfecting solutions.    HPI Michelle Kline presents for f/up -   She is taking an over-the-counter iron supplement.  She tells me she walks several miles a day and does not experience headache, blurred vision, chest pain, shortness of breath, palpitations, edema, or fatigue.  She complains of weight gain.  Outpatient Medications Prior to Visit  Medication Sig Dispense Refill  . amLODipine (NORVASC) 5 MG tablet Take 1 tablet (5 mg total) by mouth daily. 90 tablet 0  . Insulin Pen Needle 32G X 6 MM MISC 1 Act by Does not apply route daily. 100 each 1  . Liraglutide -Weight Management (SAXENDA) 18 MG/3ML SOPN Inject 3 mg into the skin daily. 9 mL 1  . spironolactone (ALDACTONE) 25 MG tablet Take 1 tablet (25 mg total) by mouth daily. 90 tablet 0   No facility-administered medications prior to visit.    ROS Review of Systems  Constitutional: Positive for unexpected weight change (wt gain). Negative for appetite change, chills, diaphoresis and fatigue.  HENT: Negative.   Eyes: Negative.   Respiratory: Negative for cough, chest tightness, shortness of breath and wheezing.   Cardiovascular: Negative for chest pain, palpitations and leg swelling.  Gastrointestinal: Negative for abdominal pain, blood in stool, constipation, diarrhea, nausea and vomiting.  Endocrine: Negative.   Genitourinary: Negative.  Negative for difficulty urinating.  Musculoskeletal: Negative.  Negative for arthralgias.  Skin: Negative.  Negative for color change.  Neurological: Negative.  Negative for dizziness, weakness,  light-headedness and headaches.  Hematological: Negative for adenopathy. Does not bruise/bleed easily.  Psychiatric/Behavioral: Negative.     Objective:  BP 138/88 (BP Location: Right Arm, Patient Position: Sitting, Cuff Size: Large)   Pulse 93   Temp 98.3 F (36.8 C) (Oral)   Resp 16   Ht 5' 6"  (1.676 m)   Wt 255 lb (115.7 kg)   SpO2 98%   BMI 41.16 kg/m   BP Readings from Last 3 Encounters:  03/25/21 138/88  02/11/21 (!) 178/96  09/16/18 (!) 178/102    Wt Readings from Last 3 Encounters:  03/25/21 255 lb (115.7 kg)  02/11/21 263 lb (119.3 kg)  01/18/17 218 lb 8 oz (99.1 kg)    Physical Exam Vitals reviewed.  Constitutional:      Appearance: She is obese.  HENT:     Nose: Nose normal.     Mouth/Throat:     Mouth: Mucous membranes are moist.  Eyes:     General: No scleral icterus.    Conjunctiva/sclera: Conjunctivae normal.  Cardiovascular:     Rate and Rhythm: Normal rate and regular rhythm.     Heart sounds: No murmur heard.   Pulmonary:     Effort: Pulmonary effort is normal.     Breath sounds: No stridor. No wheezing, rhonchi or rales.  Abdominal:     General: Abdomen is protuberant. There is no distension.     Palpations: Abdomen is soft. There is no hepatomegaly, splenomegaly or mass.     Tenderness: There is no abdominal tenderness.  Musculoskeletal:        General: Normal range of motion.  Cervical back: Neck supple.     Right lower leg: No edema.     Left lower leg: No edema.  Lymphadenopathy:     Cervical: No cervical adenopathy.  Skin:    General: Skin is warm and dry.  Neurological:     General: No focal deficit present.     Mental Status: She is alert.     Lab Results  Component Value Date   WBC 5.4 03/25/2021   HGB 11.5 (L) 03/25/2021   HCT 35.2 (L) 03/25/2021   PLT 323.0 03/25/2021   GLUCOSE 88 03/25/2021   CHOL 168 02/11/2021   TRIG 66.0 02/11/2021   HDL 55.90 02/11/2021   LDLCALC 99 02/11/2021   ALT 13 02/11/2021   AST  17 02/11/2021   NA 138 03/25/2021   K 3.7 03/25/2021   CL 104 03/25/2021   CREATININE 0.92 03/25/2021   BUN 12 03/25/2021   CO2 29 03/25/2021   TSH 2.00 02/11/2021   HGBA1C 5.5 02/11/2021    MM 3D SCREEN BREAST BILATERAL  Result Date: 03/04/2021 CLINICAL DATA:  Screening. EXAM: DIGITAL SCREENING BILATERAL MAMMOGRAM WITH TOMOSYNTHESIS AND CAD TECHNIQUE: Bilateral screening digital craniocaudal and mediolateral oblique mammograms were obtained. Bilateral screening digital breast tomosynthesis was performed. The images were evaluated with computer-aided detection. COMPARISON:  Previous exam(s). ACR Breast Density Category b: There are scattered areas of fibroglandular density. FINDINGS: In the right breast an asymmetry requires further evaluation. In the left breast and asymmetry requires further evaluation. IMPRESSION: Further evaluation is suggested for possible asymmetry in the right breast. Further evaluation is suggested for possible asymmetry in the left breast. RECOMMENDATION: Diagnostic mammogram and possibly ultrasound of both breasts. (Code:[id number]) The patient will be contacted regarding the findings, and additional imaging will be scheduled. BI-RADS CATEGORY  0: Incomplete. Need additional imaging evaluation and/or prior mammograms for comparison. Electronically Signed   By: Dorise Bullion III M.D   On: 03/04/2021 14:35    Assessment & Plan:   Chae was seen today for anemia and hypertension.  Diagnoses and all orders for this visit:  Essential hypertension- Her blood pressure is adequately well controlled. -     Basic metabolic panel; Future -     Basic metabolic panel  Hyperaldosteronism ([place])- Her blood pressure is well controlled, her electrolytes are normal, will continue the current dose of spironolactone. -     Basic metabolic panel; Future -     Basic metabolic panel  Deficiency anemia- I will screen her for vitamin deficiencies. -     Vitamin B12; Future -      Iron; Future -     Folate; Future -     Ferritin; Future -     CBC with Differential/Platelet; Future -     Reticulocytes; Future -     Vitamin B12 -     Iron -     Folate -     Ferritin -     CBC with Differential/Platelet -     Reticulocytes  Iron deficiency anemia due to chronic blood loss- I recommended that she consider receiving a series of iron infusions and to start taking an oral iron supplement. -     Ambulatory referral to Hematology / Oncology -     Ferric Maltol (ACCRUFER) 30 MG CAPS; Take 1 capsule by mouth in the morning and at bedtime.   I am having Monita Fawver start on Craig. I am also having her maintain her Saxenda, Insulin Pen Needle, amLODipine, and spironolactone.  Meds ordered this encounter  Medications  . Ferric Maltol (ACCRUFER) 30 MG CAPS    Sig: Take 1 capsule by mouth in the morning and at bedtime.    Dispense:  180 capsule    Refill:  1     Follow-up: Return in about 6 months (around 09/25/2021).  Scarlette Calico, MD

## 2021-03-26 ENCOUNTER — Telehealth: Payer: Self-pay | Admitting: *Deleted

## 2021-03-26 DIAGNOSIS — D500 Iron deficiency anemia secondary to blood loss (chronic): Secondary | ICD-10-CM | POA: Insufficient documentation

## 2021-03-26 MED ORDER — ACCRUFER 30 MG PO CAPS: 1.0000 | ORAL_CAPSULE | Freq: Two times a day (BID) | ORAL | 1 refills | Status: DC

## 2021-03-26 NOTE — Telephone Encounter (Signed)
Per referral 03/26/21 Dr. Ronnald Ramp - called and lvm of upcoming appointments - mailed welcome packet with calendar

## 2021-04-18 ENCOUNTER — Other Ambulatory Visit: Payer: Self-pay | Admitting: Family

## 2021-04-18 DIAGNOSIS — D500 Iron deficiency anemia secondary to blood loss (chronic): Secondary | ICD-10-CM

## 2021-04-18 DIAGNOSIS — D649 Anemia, unspecified: Secondary | ICD-10-CM

## 2021-04-19 ENCOUNTER — Other Ambulatory Visit: Payer: Self-pay | Admitting: Internal Medicine

## 2021-04-19 DIAGNOSIS — Z6841 Body Mass Index (BMI) 40.0 and over, adult: Secondary | ICD-10-CM

## 2021-04-21 ENCOUNTER — Inpatient Hospital Stay: Payer: No Typology Code available for payment source | Admitting: Family

## 2021-04-21 ENCOUNTER — Inpatient Hospital Stay: Payer: No Typology Code available for payment source | Attending: Internal Medicine

## 2021-04-24 IMAGING — MG MM DIGITAL SCREENING BILAT W/ TOMO AND CAD
8 of 14 series · 8 of 40 positions shown · non-contrast
Comparison: Previous exam(s).

CLINICAL DATA: Screening.

EXAM:
DIGITAL SCREENING BILATERAL MAMMOGRAM WITH TOMOSYNTHESIS AND CAD
TECHNIQUE: Bilateral screening digital craniocaudal and mediolateral oblique
mammograms were obtained. Bilateral screening digital breast
tomosynthesis was performed. The images were evaluated with
computer-aided detection.

[R CC synth-2D]
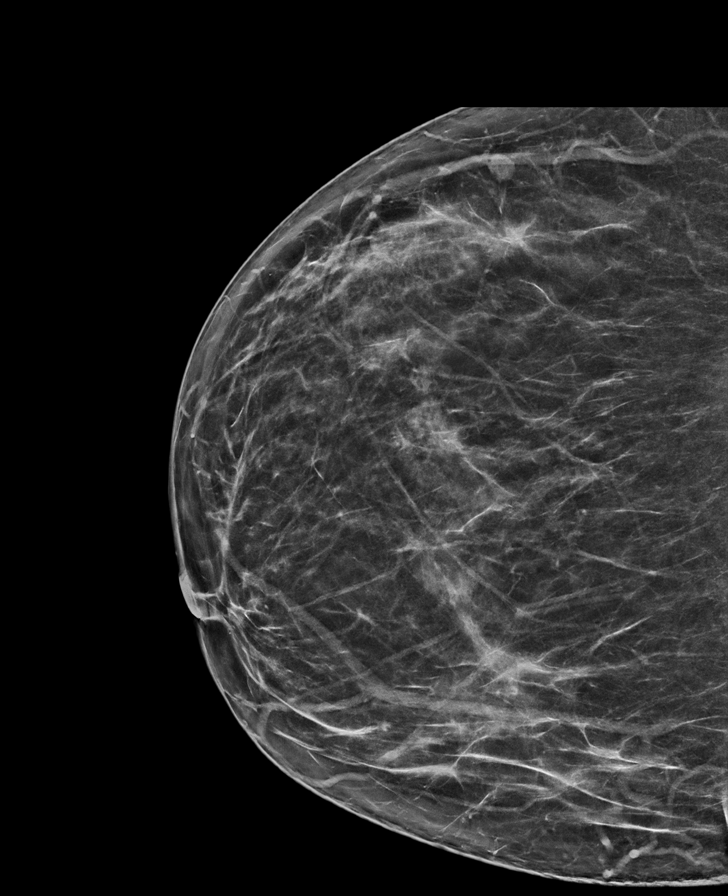

[R MLO synth-2D (1 of 2)]
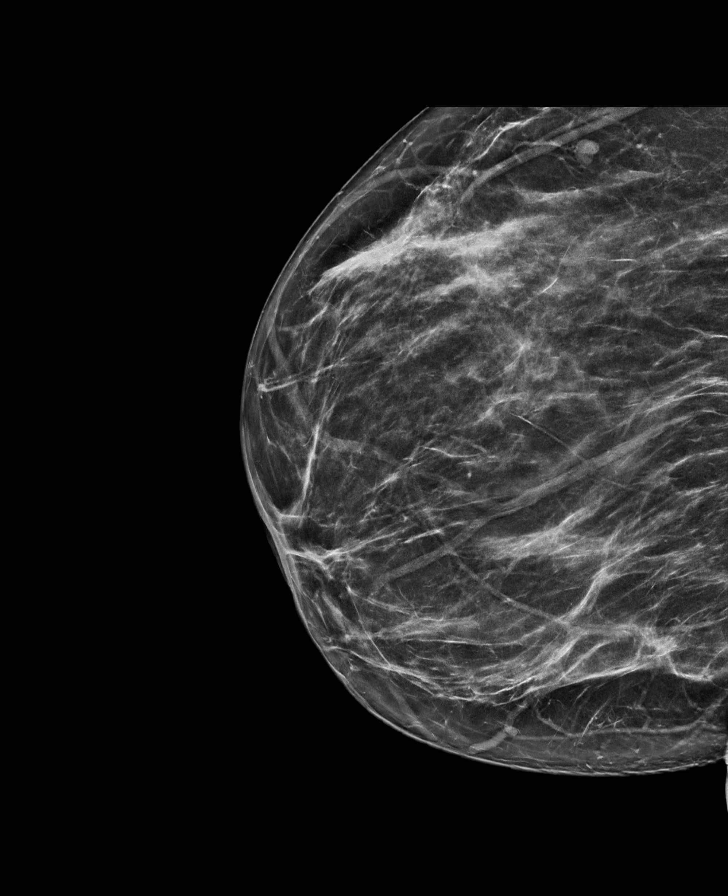

[R CV synth-2D]
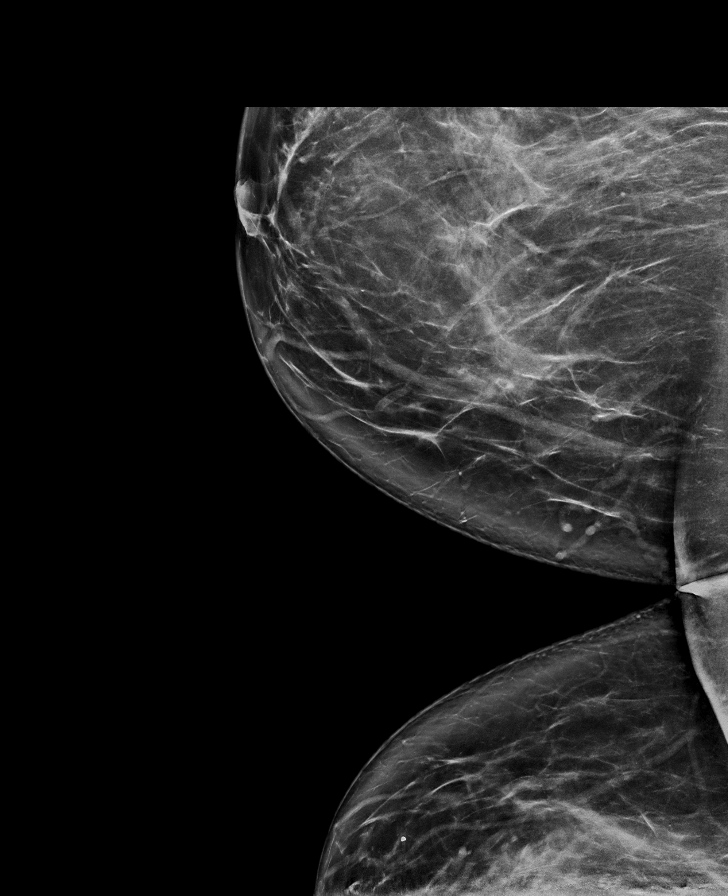

[L MLO synth-2D (1 of 2)]
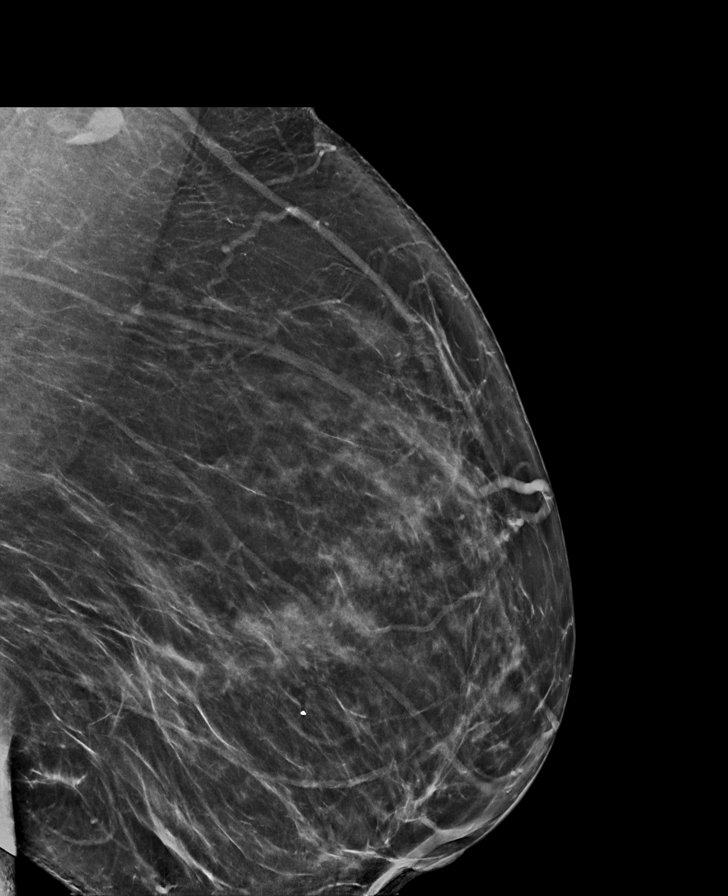

[L MLO synth-2D (2 of 2)]
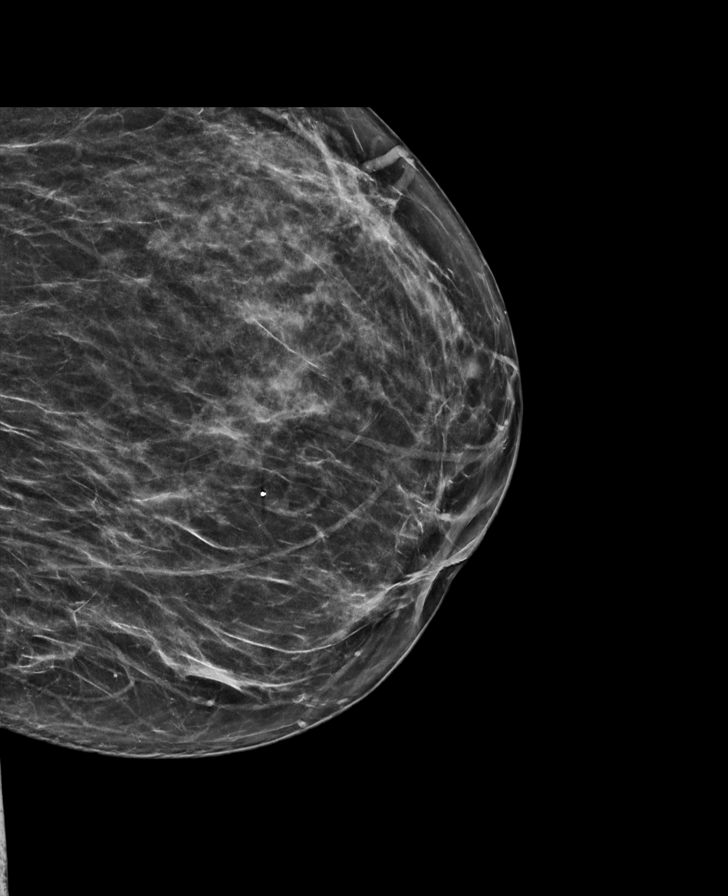

[L CC synth-2D]
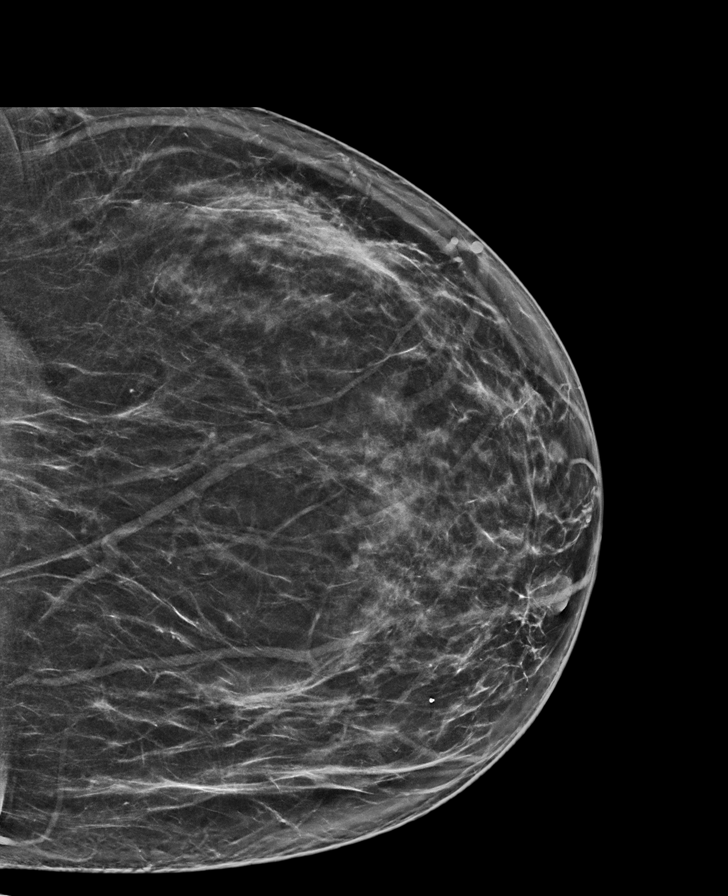

[R MLO synth-2D (2 of 2)]
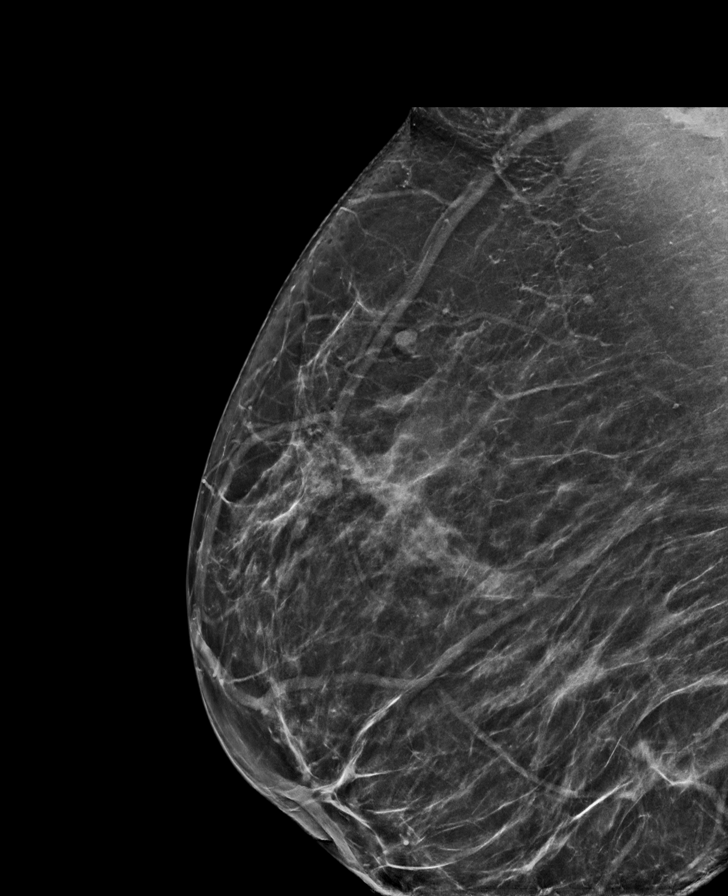

[L CC tomo · tomo slice 41/80.0]
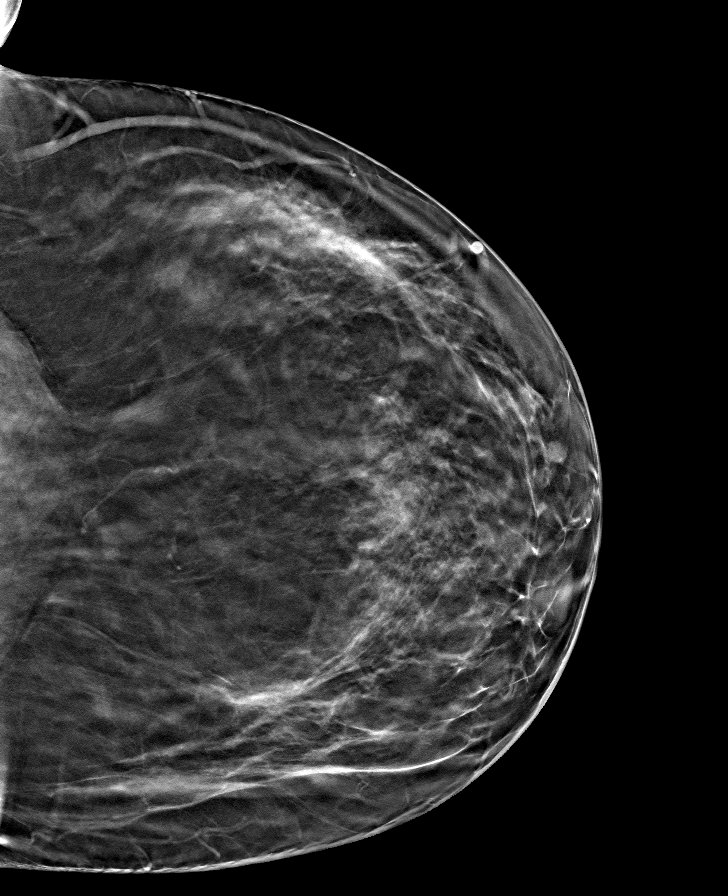

[8 of 40 positions shown; findings below may reference images not displayed]

ACR Breast Density Category b: There are scattered areas of
fibroglandular density.
FINDINGS: In the right breast an asymmetry requires further evaluation.

In the left breast and asymmetry requires further evaluation.
IMPRESSION: Further evaluation is suggested for possible asymmetry in the right
breast.

Further evaluation is suggested for possible asymmetry in the left
breast.

RECOMMENDATION:
Diagnostic mammogram and possibly ultrasound of both breasts.
(Code:[id number])

The patient will be contacted regarding the findings, and additional
imaging will be scheduled.

BI-RADS CATEGORY  0: Incomplete. Need additional imaging evaluation
and/or prior mammograms for comparison.

## 2021-05-04 ENCOUNTER — Other Ambulatory Visit: Payer: Self-pay | Admitting: Internal Medicine

## 2021-05-04 DIAGNOSIS — Z6841 Body Mass Index (BMI) 40.0 and over, adult: Secondary | ICD-10-CM

## 2021-05-04 DIAGNOSIS — I10 Essential (primary) hypertension: Secondary | ICD-10-CM

## 2021-05-07 ENCOUNTER — Other Ambulatory Visit: Payer: Self-pay | Admitting: Internal Medicine

## 2021-05-07 DIAGNOSIS — I10 Essential (primary) hypertension: Secondary | ICD-10-CM

## 2021-05-07 DIAGNOSIS — E269 Hyperaldosteronism, unspecified: Secondary | ICD-10-CM

## 2021-05-10 ENCOUNTER — Other Ambulatory Visit: Payer: Self-pay | Admitting: Internal Medicine

## 2021-05-10 DIAGNOSIS — I10 Essential (primary) hypertension: Secondary | ICD-10-CM

## 2021-06-07 ENCOUNTER — Telehealth: Payer: No Typology Code available for payment source | Admitting: Nurse Practitioner

## 2021-06-07 DIAGNOSIS — T6591XA Toxic effect of unspecified substance, accidental (unintentional), initial encounter: Secondary | ICD-10-CM

## 2021-06-07 MED ORDER — PREDNISONE 10 MG (21) PO TBPK: ORAL_TABLET | ORAL | 0 refills | Status: DC

## 2021-06-07 NOTE — Progress Notes (Signed)
Virtual Visit Consent   Michelle Kline, you are scheduled for a virtual visit with Mary-Margaret Hassell Done, FNP, a [facility] provider, today.     Just as with appointments in the office, your consent must be obtained to participate.  Your consent will be active for this visit and any virtual visit you may have with one of our providers in the next 365 days.     If you have a MyChart account, a copy of this consent can be sent to you electronically.  All virtual visits are billed to your insurance company just like a traditional visit in the office.    As this is a virtual visit, video technology does not allow for your provider to perform a traditional examination.  This may limit your provider's ability to fully assess your condition.  If your provider identifies any concerns that need to be evaluated in person or the need to arrange testing (such as labs, EKG, etc.), we will make arrangements to do so.     Although advances in technology are sophisticated, we cannot ensure that it will always work on either your end or our end.  If the connection with a video visit is poor, the visit may have to be switched to a telephone visit.  With either a video or telephone visit, we are not always able to ensure that we have a secure connection.     I need to obtain your verbal consent now.   Are you willing to proceed with your visit today? YES   Michelle Kline has provided verbal consent on 06/07/2021 for a virtual visit (video or telephone).   Mary-Margaret Hassell Done, FNP   Date: 06/07/2021 10:38 AM   Virtual Visit via Video Note   I, Mary-Margaret Hassell Done, connected with Michelle Kline ([id number], 07-31-1976) on 06/07/21 at 10:45 AM EDT by a video-enabled telemedicine application and verified that I am speaking with the correct person using two identifiers.  Location: Patient: Virtual Visit Location Patient: Home Provider: Virtual Visit Location Provider: Mobile   I discussed the limitations of evaluation  and management by telemedicine and the availability of in person appointments. The patient expressed understanding and agreed to proceed.    History of Present Illness: Michelle Kline is a 45 y.o. who identifies as a female who was assigned female at birth, and is being seen today for allergic reaction.  HPI:  Patient dyed her hair with  clariol hair die. She used black , and says on box may cause allergic reaction. Now the left side of her face is swollen and head is itching. She took some benadyl yesterday which helped some with itching. Has fine bumps behind her left ear. Problems:  Patient Active Problem List   Diagnosis Date Noted   Iron deficiency anemia due to chronic blood loss 03/26/2021   Hyperaldosteronism ([place]) 02/25/2021   Deficiency anemia 02/15/2021   Class 3 severe obesity due to excess calories with serious comorbidity and body mass index (BMI) of 40.0 to 44.9 in adult [facility]) 02/11/2021   Encounter for general adult medical examination with abnormal findings 09/11/2016   Obesity 01/13/2007   MURMUR, FUNCTIONAL 01/13/2007   Essential hypertension 01/13/2007    Allergies: No Known Allergies Medications:  Current Outpatient Medications:    amLODipine (NORVASC) 5 MG tablet, TAKE 1 TABLET (5 MG TOTAL) BY MOUTH DAILY., Disp: 90 tablet, Rfl: 0   BD PEN NEEDLE MICRO U/F 32G X 6 MM MISC, 1 ACT BY DOES NOT APPLY ROUTE DAILY.,  Disp: 100 each, Rfl: 1   Ferric Maltol (ACCRUFER) 30 MG CAPS, Take 1 capsule by mouth in the morning and at bedtime., Disp: 180 capsule, Rfl: 1   SAXENDA 18 MG/3ML SOPN, INJECT 3 MG INTO THE SKIN DAILY., Disp: 9 mL, Rfl: 1   spironolactone (ALDACTONE) 25 MG tablet, TAKE 1 TABLET (25 MG TOTAL) BY MOUTH DAILY., Disp: 90 tablet, Rfl: 0  Observations/Objective: Patient is well-developed, well-nourished in no acute distress.  Resting comfortably  at home.  Head is normocephalic, atraumatic.  No labored breathing.  Speech is clear and coherent with logical content.   Patient is alert and oriented at baseline.  Fine bumps behind left ear.  Assess Michelle Kline in today with chief complaint of No chief complaint on file.   1. Allergic reaction to chemical substance, accidental or unintentional, initial encounter Continue benadryl OTC. Meds ordered this encounter  Medications   predniSONE (STERAPRED UNI-PAK 21 TAB) 10 MG (21) TBPK tablet    Sig: As directed x 6 days    Dispense:  21 tablet    Refill:  0    Order Specific Question:   Supervising Provider    Answer:   Noemi Chapel [[id number]]       Follow Up Instructions: I discussed the assessment and treatment plan with the patient. The patient was provided an opportunity to ask questions and all were answered. The patient agreed with the plan and demonstrated an understanding of the instructions.  A copy of instructions were sent to the patient via MyChart.  The patient was advised to call back or seek an in-person evaluation if the symptoms worsen or if the condition fails to improve as anticipated.  Time:  I spent 10 minutes with the patient via telehealth technology discussing the above problems/concerns.    Mary-Margaret Hassell Done, FNP

## 2021-06-20 ENCOUNTER — Encounter: Payer: Self-pay | Admitting: Physician Assistant

## 2021-06-20 ENCOUNTER — Telehealth: Payer: No Typology Code available for payment source | Admitting: Physician Assistant

## 2021-06-20 DIAGNOSIS — U071 COVID-19: Secondary | ICD-10-CM | POA: Diagnosis not present

## 2021-06-20 MED ORDER — BENZONATATE 100 MG PO CAPS: 100.0000 mg | ORAL_CAPSULE | Freq: Three times a day (TID) | ORAL | 0 refills | Status: DC | PRN

## 2021-06-20 MED ORDER — NIRMATRELVIR/RITONAVIR (PAXLOVID)TABLET
3.0000 | ORAL_TABLET | Freq: Two times a day (BID) | ORAL | 0 refills | Status: AC
Start: 2021-06-20 — End: 2021-06-25

## 2021-06-20 NOTE — Progress Notes (Signed)
Virtual Visit Consent   Michelle Kline, you are scheduled for a virtual visit with a [facility] provider today.     Just as with appointments in the office, your consent must be obtained to participate.  Your consent will be active for this visit and any virtual visit you may have with one of our providers in the next 365 days.     If you have a MyChart account, a copy of this consent can be sent to you electronically.  All virtual visits are billed to your insurance company just like a traditional visit in the office.    As this is a virtual visit, video technology does not allow for your provider to perform a traditional examination.  This may limit your provider's ability to fully assess your condition.  If your provider identifies any concerns that need to be evaluated in person or the need to arrange testing (such as labs, EKG, etc.), we will make arrangements to do so.     Although advances in technology are sophisticated, we cannot ensure that it will always work on either your end or our end.  If the connection with a video visit is poor, the visit may have to be switched to a telephone visit.  With either a video or telephone visit, we are not always able to ensure that we have a secure connection.     I need to obtain your verbal consent now.   Are you willing to proceed with your visit today?    Emmaleah Meroney has provided verbal consent on 06/20/2021 for a virtual visit (video or telephone).   Mar Daring, PA-C   Date: 06/20/2021 9:26 AM   Virtual Visit via Video Note   I, Mar Daring, connected with  Michelle Kline  ([id number], 07/07/1976) on 06/20/21 at  9:30 AM EDT by a video-enabled telemedicine application and verified that I am speaking with the correct person using two identifiers.  Location: Patient: Virtual Visit Location Patient: Home Provider: Virtual Visit Location Provider: Home Office   I discussed the limitations of evaluation and management by telemedicine  and the availability of in person appointments. The patient expressed understanding and agreed to proceed.    History of Present Illness: Michelle Kline is a 45 y.o. who identifies as a female who was assigned female at birth, and is being seen today for Covid 28.  HPI: URI  This is a new problem. Episode onset: symptoms started wednesday with a headache, tested positive today. The problem has been gradually worsening. Maximum temperature: felt feverish but unable to check. Associated symptoms include congestion, coughing (dry), headaches, sinus pain and a sore throat (scratchy). Pertinent negatives include no diarrhea, ear pain, nausea, plugged ear sensation, rhinorrhea, sneezing or vomiting. Associated symptoms comments: Body aches, feverish feeling. Treatments tried: nyquil, airborne. The treatment provided mild relief.    Problems:  Patient Active Problem List   Diagnosis Date Noted   Iron deficiency anemia due to chronic blood loss 03/26/2021   Hyperaldosteronism ([place]) 02/25/2021   Deficiency anemia 02/15/2021   Class 3 severe obesity due to excess calories with serious comorbidity and body mass index (BMI) of 40.0 to 44.9 in adult [facility]) 02/11/2021   Encounter for general adult medical examination with abnormal findings 09/11/2016   Obesity 01/13/2007   MURMUR, FUNCTIONAL 01/13/2007   Essential hypertension 01/13/2007    Allergies: No Known Allergies Medications:  Current Outpatient Medications:    benzonatate (TESSALON) 100 MG capsule, Take 1 capsule (100  mg total) by mouth 3 (three) times daily as needed., Disp: 30 capsule, Rfl: 0   nirmatrelvir/ritonavir EUA (PAXLOVID) TABS, Take 3 tablets by mouth 2 (two) times daily for 5 days. (Take nirmatrelvir 150 mg two tablets twice daily for 5 days and ritonavir 100 mg one tablet twice daily for 5 days) Patient GFR is 75, Disp: 30 tablet, Rfl: 0   amLODipine (NORVASC) 5 MG tablet, TAKE 1 TABLET (5 MG TOTAL) BY MOUTH DAILY., Disp: 90 tablet, Rfl:  0   BD PEN NEEDLE MICRO U/F 32G X 6 MM MISC, 1 ACT BY DOES NOT APPLY ROUTE DAILY., Disp: 100 each, Rfl: 1   Ferric Maltol (ACCRUFER) 30 MG CAPS, Take 1 capsule by mouth in the morning and at bedtime., Disp: 180 capsule, Rfl: 1   SAXENDA 18 MG/3ML SOPN, INJECT 3 MG INTO THE SKIN DAILY., Disp: 9 mL, Rfl: 1   spironolactone (ALDACTONE) 25 MG tablet, TAKE 1 TABLET (25 MG TOTAL) BY MOUTH DAILY., Disp: 90 tablet, Rfl: 0  Observations/Objective: Patient is well-developed, well-nourished in no acute distress.  Resting comfortably at home.  Head is normocephalic, atraumatic.  No labored breathing.  Speech is clear and coherent with logical content.  Patient is alert and oriented at baseline.    Assessment and Plan: 1. COVID-19 - nirmatrelvir/ritonavir EUA (PAXLOVID) TABS; Take 3 tablets by mouth 2 (two) times daily for 5 days. (Take nirmatrelvir 150 mg two tablets twice daily for 5 days and ritonavir 100 mg one tablet twice daily for 5 days) Patient GFR is 75  Dispense: 30 tablet; Refill: 0 - benzonatate (TESSALON) 100 MG capsule; Take 1 capsule (100 mg total) by mouth 3 (three) times daily as needed.  Dispense: 30 capsule; Refill: 0 - Continue OTC symptomatic management of choice - Will send OTC vitamins and supplement information through AVS - Paxlovid prescribed for patient - Tessalon perles for symptomatic management of cough - Patient enrolled in MyChart symptom monitoring - Push fluids - Rest as needed - Discussed return precautions and when to seek in-person evaluation, sent via AVS as well  Follow Up Instructions: I discussed the assessment and treatment plan with the patient. The patient was provided an opportunity to ask questions and all were answered. The patient agreed with the plan and demonstrated an understanding of the instructions.  A copy of instructions were sent to the patient via MyChart.  The patient was advised to call back or seek an in-person evaluation if the symptoms  worsen or if the condition fails to improve as anticipated.  Time:  I spent 15 minutes with the patient via telehealth technology discussing the above problems/concerns.    Mar Daring, PA-C

## 2021-06-20 NOTE — Patient Instructions (Signed)
Hello Michelle Kline,  You are being placed in the home monitoring program for COVID-19 (commonly known as Coronavirus).  This is because you are suspected to have the virus or are known to have the virus.  If you are unsure which group you fall into call your clinic.    As part of this program, you'll answer a daily questionnaire in the MyChart mobile app. You'll receive a notification through the MyChart app when the questionnaire is available. When you log in to MyChart, you'll see the tasks in your To Do activity.       Clinicians will see any answers that are concerning and take appropriate steps.  If at any point you are having a medical emergency, call 911.  If otherwise concerned call your clinic instead of coming into the clinic or hospital.  To keep from spreading the disease you should: Stay home and limit contact with other people as much as possible.  Wash your hands frequently. Cover your coughs and sneezes with a tissue, and throw used tissues in the trash.   Clean and disinfect frequently touched surfaces and objects.    Take care of yourself by: Staying home Resting Drinking fluids Take fever-reducing medications (Tylenol/Acetaminophen and Ibuprofen)  For more information on the disease go to the Centers for Disease Control and Prevention website     COVID-19: What to Do if You Are Sick CDC has updated isolation and quarantine recommendations for the public, and is revising the CDC website to reflect these changes. These recommendations do not apply to healthcare personnel and do not supersede state, local, tribal, or territorial laws, rules, andregulations. If you have a fever, cough or other symptoms, you might have COVID-19. Most people have mild illness and are able to recover at home. If you are sick: Keep track of your symptoms. If you have an emergency warning sign (including trouble breathing), call 911. Steps to help prevent the spread of COVID-19 if you are sick If  you are sick with COVID-19 or think you might have COVID-19, follow the steps below to care for yourself and to help protect other peoplein your home and community. Stay home except to get medical care Stay home. Most people with COVID-19 have mild illness and can recover at home without medical care. Do not leave your home, except to get medical care. Do not visit public areas. Take care of yourself. Get rest and stay hydrated. Take over-the-counter medicines, such as acetaminophen, to help you feel better. Stay in touch with your doctor. Call before you get medical care. Be sure to get care if you have trouble breathing, or have any other emergency warning signs, or if you think it is an emergency. Avoid public transportation, ride-sharing, or taxis. Separate yourself from other people As much as possible, stay in a specific room and away from other people and pets in your home. If possible, you should use a separate bathroom. If you need to be around other people or animals in oroutside of the home, wear a mask. Tell your close contactsthat they may have been exposed to COVID-19. An infected person can spread COVID-19 starting 48 hours (or 2 days) before the person has any symptoms or tests positive. By letting your close contacts know they may have been exposed to COVID-19, you are helping to protect everyone. Additional guidance is available for those living in close quarters and shared housing. See COVID-19 and Animals if you have questions about pets. If you are diagnosed with  COVID-19, someone from the health department may call you. Answer the call to slow the spread. Monitor your symptoms Symptoms of COVID-19 include fever, cough, or other symptoms. Follow care instructions from your healthcare provider and local health department. Your local health authorities may give instructions on checking your symptoms and reporting information. When to seek emergency medical attention Look for  emergency warning signs* for COVID-19. If someone is showing any of these signs, seek emergency medical care immediately: Trouble breathing Persistent pain or pressure in the chest New confusion Inability to wake or stay awake Pale, gray, or blue-colored skin, lips, or nail beds, depending on skin tone *This list is not all possible symptoms. Please call your medical provider forany other symptoms that are severe or concerning to you. Call 911 or call ahead to your local emergency facility: Notify the operator that you are seeking care for someone who has or may haveCOVID-19. Call ahead before visiting your doctor Call ahead. Many medical visits for routine care are being postponed or done by phone or telemedicine. If you have a medical appointment that cannot be postponed, call your doctor's office, and tell them you have or may have COVID-19. This will help the office protect themselves and other patients. Get tested If you have symptoms of COVID-19, get tested. While waiting for test results, you stay away from others, including staying apart from those living in your household. Self-tests are one of several options for testing for the virus that causes COVID-19 and may be more convenient than laboratory-based tests and point-of-care tests. Ask your healthcare provider or your local health department if you need help interpreting your test results. You can visit your state, tribal, local, and territorial health department's website to look for the latest local information on testing sites. If you are sick, wear a mask over your nose and mouth You should wear a mask over your nose and mouth if you must be around other people or animals, including pets (even at home). You don't need to wear the mask if you are alone. If you can't put on a mask (because of trouble breathing, for example), cover your coughs and sneezes in some other way. Try to stay at least 6 feet away from other people. This will  help protect the people around you. Masks should not be placed on young children under age 24 years, anyone who has trouble breathing, or anyone who is not able to remove the mask without help. Note: During the COVID-19 pandemic, medical grade facemasks are reserved forhealthcare workers and some first responders. Cover your coughs and sneezes Cover your mouth and nose with a tissue when you cough or sneeze. Throw away used tissues in a lined trash can. Immediately wash your hands with soap and water for at least 20 seconds. If soap and water are not available, clean your hands with an alcohol-based hand sanitizer that contains at least 60% alcohol. Clean your hands often Wash your hands often with soap and water for at least 20 seconds. This is especially important after blowing your nose, coughing, or sneezing; going to the bathroom; and before eating or preparing food. Use hand sanitizer if soap and water are not available. Use an alcohol-based hand sanitizer with at least 60% alcohol, covering all surfaces of your hands and rubbing them together until they feel dry. Soap and water are the best option, especially if hands are visibly dirty. Avoid touching your eyes, nose, and mouth with unwashed hands. Handwashing Tips Avoid sharing  personal household items Do not share dishes, drinking glasses, cups, eating utensils, towels, or bedding with other people in your home. Wash these items thoroughly after using them with soap and water or put in the dishwasher. Clean all "high-touch" surfaces every day Clean and disinfect high-touch surfaces in your "sick room" and bathroom; wear disposable gloves. Let someone else clean and disinfect surfaces in common areas, but you should clean your bedroom and bathroom, if possible. If a caregiver or other person needs to clean and disinfect a sick person's bedroom or bathroom, they should do so on an as-needed basis. The caregiver/other person should wear a mask  and disposable gloves prior to cleaning. They should wait as long as possible after the person who is sick has used the bathroom before coming in to clean and use the bathroom. High-touch surfaces include phones, remote controls, counters, tabletops, doorknobs, bathroom fixtures, toilets, keyboards, tablets, and bedside tables. Clean and disinfect areas that may have blood, stool, or body fluids on them. Use household cleaners and disinfectants. Clean the area or item with soap and water or another detergent if it is dirty. Then, use a household disinfectant. Be sure to follow the instructions on the label to ensure safe and effective use of the product. Many products recommend keeping the surface wet for several minutes to ensure germs are killed. Many also recommend precautions such as wearing gloves and making sure you have good ventilation during use of the product. Use a product from [employer] List N: Disinfectants for Coronavirus (BVQXI-50). Complete Disinfection Guidance When you can be around others after being sick with COVID-19 Deciding when you can be around others is different for different situations. Find out when you can safely end home isolation. For any additional questions about your care,contact your healthcare provider or state or local health department. 10/23/2020 Content source: [facility] for Immunization and Respiratory Diseases ([facility]), Division of Viral Diseases This information is not intended to replace advice given to you by your health care provider. Make sure you discuss any questions you have with your healthcare provider. Document Revised: 12/20/2020 Document Reviewed: 12/20/2020 Elsevier Patient Education  2022 [place] are being prescribed PAXLOVID for COVID-19 infection.   Please call the pharmacy or go through the drive through vs going inside if you are picking up the mediation yourself to prevent further spread. If prescribed to a [facility]  affiliated pharmacy, a pharmacist will bring the medication out to your car.   Medications to hold while taking this treatment: None  *If asked to hold, you can resume them 24 hours after your last dose   ADMINISTRATION INSTRUCTIONS: Take with or without food. Swallow the tablets whole. Don't chew, crush, or break the medications because it might not work as well  For each dose of the medication, you should be taking 3 tablets together (2 pink oval and 1 white oval) TWICE a day for FIVE days   Finish your full five-day course of Paxlovid even if you feel better before you're done. Stopping this medication too early can make it less effective to prevent severe illness related to [place].    Paxlovid is prescribed for YOU ONLY. Don't share it with others, even if they have similar symptoms as you. This medication might not be right for everyone.  Make sure to take steps to protect yourself and others while you're taking this medication in order to get well soon and to prevent others from getting sick with COVID-19.  Paxlovid (nirmatrelvir / ritonavir) can cause hormonal birth control medications to not work well. If you or your partner is currently taking hormonal birth control, use condoms or other birth control methods to prevent unintended pregnancies.    COMMON SIDE EFFECTS: Altered or bad taste in your mouth  Diarrhea  High blood pressure (1% of people) Muscle aches (1% of people)     If your COVID-19 symptoms get worse, get medical help right away. Call 911 if you experience symptoms such as worsening cough, trouble breathing, chest pain that doesn't go away, confusion, a hard time staying awake, and pale or blue-colored skin. This medication won't prevent all COVID-19 cases from getting worse.  Can take to lessen severity: Vit C 535m twice daily Quercertin 2867-544BEtwice daily Zinc 75-1093mdaily Melatonin 3-6 mg at bedtime Vit D3 1000-2000 IU daily Aspirin 81 mg daily  with food Optional: Famotidine 2066maily Also can add tylenol/ibuprofen as needed for fevers and body aches May add Mucinex or Mucinex DM as needed for cough/congestion  10 Things You Can Do to Manage Your COVID-19 Symptoms at Home If you have possible or confirmed COVID-19 Stay home except to get medical care. Monitor your symptoms carefully. If your symptoms get worse, call your healthcare provider immediately. Get rest and stay hydrated. If you have a medical appointment, call the healthcare provider ahead of time and tell them that you have or may have COVID-19. For medical emergencies, call 911 and notify the dispatch personnel that you have or may have COVID-19. Cover your cough and sneezes with a tissue or use the inside of your elbow. Wash your hands often with soap and water for at least 20 seconds or clean your hands with an alcohol-based hand sanitizer that contains at least 60% alcohol. As much as possible, stay in a specific room and away from other people in your home. Also, you should use a separate bathroom, if available. If you need to be around other people in or outside of the home, wear a mask. Avoid sharing personal items with other people in your household, like dishes, towels, and bedding. Clean all surfaces that are touched often, like counters, tabletops, and doorknobs. Use household cleaning sprays or wipes according to the label instructions. [email or web address] This information is not intended to replace advice given to you by your health care provider. Make sure you discuss any questions you have with your healthcare provider. Document Revised: 12/20/2020 Document Reviewed: 12/20/2020 Elsevier Patient Education  [id number]

## 2021-06-24 ENCOUNTER — Telehealth: Payer: Self-pay | Admitting: Internal Medicine

## 2021-06-24 NOTE — Telephone Encounter (Signed)
PA has been initiated

## 2021-07-01 ENCOUNTER — Telehealth: Payer: Self-pay

## 2021-07-01 NOTE — Telephone Encounter (Signed)
Attempted to contact patient in regards to new symptoms on questionnaire. VM left and mychart message sent with guidelines for symptom.

## 2021-07-02 NOTE — Telephone Encounter (Signed)
   [employer] calling to be sure all of PA form is completed to avoid denial

## 2021-07-04 NOTE — Telephone Encounter (Signed)
PA form received by fax. Scanned into media

## 2021-07-10 ENCOUNTER — Telehealth: Payer: No Typology Code available for payment source

## 2021-07-17 ENCOUNTER — Other Ambulatory Visit: Payer: Self-pay | Admitting: Internal Medicine

## 2021-07-17 DIAGNOSIS — E269 Hyperaldosteronism, unspecified: Secondary | ICD-10-CM

## 2021-07-17 DIAGNOSIS — I10 Essential (primary) hypertension: Secondary | ICD-10-CM

## 2021-07-24 ENCOUNTER — Other Ambulatory Visit: Payer: Self-pay | Admitting: Internal Medicine

## 2021-07-24 DIAGNOSIS — Z6841 Body Mass Index (BMI) 40.0 and over, adult: Secondary | ICD-10-CM

## 2021-10-13 ENCOUNTER — Other Ambulatory Visit: Payer: Self-pay | Admitting: Internal Medicine

## 2021-10-13 DIAGNOSIS — E269 Hyperaldosteronism, unspecified: Secondary | ICD-10-CM

## 2021-10-13 DIAGNOSIS — I10 Essential (primary) hypertension: Secondary | ICD-10-CM

## 2022-01-15 ENCOUNTER — Other Ambulatory Visit: Payer: Self-pay | Admitting: Internal Medicine

## 2022-01-15 DIAGNOSIS — E269 Hyperaldosteronism, unspecified: Secondary | ICD-10-CM

## 2022-01-15 DIAGNOSIS — I10 Essential (primary) hypertension: Secondary | ICD-10-CM

## 2022-01-16 ENCOUNTER — Other Ambulatory Visit: Payer: Self-pay | Admitting: Internal Medicine

## 2022-01-16 DIAGNOSIS — E269 Hyperaldosteronism, unspecified: Secondary | ICD-10-CM

## 2022-01-16 DIAGNOSIS — I10 Essential (primary) hypertension: Secondary | ICD-10-CM

## 2022-01-29 ENCOUNTER — Other Ambulatory Visit: Payer: Self-pay | Admitting: Internal Medicine

## 2022-01-29 ENCOUNTER — Encounter: Payer: Self-pay | Admitting: Internal Medicine

## 2022-01-29 MED ORDER — SPIRONOLACTONE 25 MG PO TABS: 25.0000 mg | ORAL_TABLET | Freq: Every day | ORAL | 0 refills | Status: DC

## 2022-01-29 MED ORDER — AMLODIPINE BESYLATE 5 MG PO TABS: 5.0000 mg | ORAL_TABLET | Freq: Every day | ORAL | 0 refills | Status: DC

## 2022-01-29 MED ORDER — ACCRUFER 30 MG PO CAPS: 1.0000 | ORAL_CAPSULE | Freq: Two times a day (BID) | ORAL | 1 refills | Status: DC

## 2022-02-13 ENCOUNTER — Encounter: Payer: No Typology Code available for payment source | Admitting: Internal Medicine

## 2022-02-22 ENCOUNTER — Other Ambulatory Visit: Payer: Self-pay | Admitting: Internal Medicine

## 2022-02-22 DIAGNOSIS — E269 Hyperaldosteronism, unspecified: Secondary | ICD-10-CM

## 2022-02-22 DIAGNOSIS — I10 Essential (primary) hypertension: Secondary | ICD-10-CM

## 2022-03-14 LAB — COLOGUARD: COLOGUARD: NEGATIVE

## 2022-03-14 LAB — EXTERNAL GENERIC LAB PROCEDURE: COLOGUARD: NEGATIVE

## 2022-04-15 ENCOUNTER — Other Ambulatory Visit (HOSPITAL_BASED_OUTPATIENT_CLINIC_OR_DEPARTMENT_OTHER): Payer: Self-pay

## 2022-04-15 MED ORDER — WEGOVY 0.25 MG/0.5ML ~~LOC~~ SOAJ
SUBCUTANEOUS | 0 refills | Status: DC
  Filled 2022-04-15: qty 2, 28d supply, fill #0

## 2022-04-16 ENCOUNTER — Other Ambulatory Visit (HOSPITAL_BASED_OUTPATIENT_CLINIC_OR_DEPARTMENT_OTHER): Payer: Self-pay

## 2022-04-16 ENCOUNTER — Encounter (HOSPITAL_BASED_OUTPATIENT_CLINIC_OR_DEPARTMENT_OTHER): Payer: Self-pay | Admitting: Pharmacist

## 2022-04-16 MED ORDER — WEGOVY 0.5 MG/0.5ML ~~LOC~~ SOAJ
0.5000 mg | SUBCUTANEOUS | 0 refills | Status: DC
  Filled 2022-04-16: qty 2, 28d supply, fill #0

## 2022-04-17 ENCOUNTER — Other Ambulatory Visit (HOSPITAL_BASED_OUTPATIENT_CLINIC_OR_DEPARTMENT_OTHER): Payer: Self-pay

## 2022-04-20 ENCOUNTER — Other Ambulatory Visit (HOSPITAL_BASED_OUTPATIENT_CLINIC_OR_DEPARTMENT_OTHER): Payer: Self-pay

## 2022-05-07 ENCOUNTER — Other Ambulatory Visit (HOSPITAL_BASED_OUTPATIENT_CLINIC_OR_DEPARTMENT_OTHER): Payer: Self-pay

## 2022-05-07 ENCOUNTER — Encounter (HOSPITAL_BASED_OUTPATIENT_CLINIC_OR_DEPARTMENT_OTHER): Payer: Self-pay

## 2022-05-07 MED ORDER — WEGOVY 1 MG/0.5ML ~~LOC~~ SOAJ
1.0000 mg | SUBCUTANEOUS | 0 refills | Status: DC
  Filled 2022-05-07 – 2022-05-11 (×2): qty 2, 28d supply, fill #0

## 2022-05-11 ENCOUNTER — Other Ambulatory Visit (HOSPITAL_BASED_OUTPATIENT_CLINIC_OR_DEPARTMENT_OTHER): Payer: Self-pay

## 2022-05-20 ENCOUNTER — Other Ambulatory Visit (HOSPITAL_BASED_OUTPATIENT_CLINIC_OR_DEPARTMENT_OTHER): Payer: Self-pay

## 2022-05-28 ENCOUNTER — Other Ambulatory Visit (HOSPITAL_BASED_OUTPATIENT_CLINIC_OR_DEPARTMENT_OTHER): Payer: Self-pay

## 2022-05-28 MED ORDER — WEGOVY 1 MG/0.5ML ~~LOC~~ SOAJ
1.0000 mg | SUBCUTANEOUS | 0 refills | Status: DC
  Filled 2022-05-28: qty 2, 28d supply, fill #0

## 2022-06-17 ENCOUNTER — Telehealth: Payer: 59 | Admitting: Family Medicine

## 2022-06-17 DIAGNOSIS — R110 Nausea: Secondary | ICD-10-CM

## 2022-06-17 DIAGNOSIS — R197 Diarrhea, unspecified: Secondary | ICD-10-CM

## 2022-06-17 MED ORDER — ONDANSETRON HCL 4 MG PO TABS: 4.0000 mg | ORAL_TABLET | Freq: Three times a day (TID) | ORAL | 0 refills | Status: DC | PRN

## 2022-06-17 NOTE — Progress Notes (Signed)
We are sorry that you are not feeling well.  Here is how we plan to help!  Based on what you have shared with me it looks like you have Acute Infectious Diarrhea.  Most cases of acute diarrhea are due to infections with virus and bacteria and are self-limited conditions lasting less than 14 days.  For your symptoms you may take Imodium 2 mg tablets that are over the counter at your local pharmacy. Take two tablet now and then one after each loose stool up to 6 a day.  Antibiotics are not needed for most people with diarrhea.  Optional: Zofran 4 mg 1 tablet every 8 hours as needed for nausea and vomiting   HOME CARE We recommend changing your diet to help with your symptoms for the next few days. Drink plenty of fluids that contain water salt and sugar. Sports drinks such as Gatorade may help.  You may try broths, soups, bananas, applesauce, soft breads, mashed potatoes or crackers.  You are considered infectious for as long as the diarrhea continues. Hand washing or use of alcohol based hand sanitizers is recommend. It is best to stay out of work or school until your symptoms stop.   GET HELP RIGHT AWAY If you have dark yellow colored urine or do not pass urine frequently you should drink more fluids.   If your symptoms worsen  If you feel like you are going to pass out (faint) You have a new problem  MAKE SURE YOU  Understand these instructions. Will watch your condition. Will get help right away if you are not doing well or get worse.  Thank you for choosing an e-visit.  Your e-visit answers were reviewed by a board certified advanced clinical practitioner to complete your personal care plan. Depending upon the condition, your plan could have included both over the counter or prescription medications.  Please review your pharmacy choice. Make sure the pharmacy is open so you can pick up prescription now. If there is a problem, you may contact your provider through [employer] and have the prescription routed to another pharmacy.  Your safety is important to [place]. If you have drug allergies check your prescription carefully.   For the next 24 hours you can use MyChart to ask questions about today's visit, request a non-urgent call back, or ask for a work or school excuse. You will get an email in the next two days asking about your experience. I hope that your e-visit has been valuable and will speed your recovery.   I have provided 5 minutes of non face to face time during this encounter for chart review and documentation.

## 2022-10-27 ENCOUNTER — Telehealth: Payer: Self-pay

## 2022-10-27 NOTE — Telephone Encounter (Signed)
Transition Care Management Unsuccessful Follow-up Telephone Call  Date of discharge and from where:  [facility] 10/23/22  Attempts:  1st Attempt  Reason for unsuccessful TCM follow-up call:  Left voice message

## 2022-10-28 NOTE — Telephone Encounter (Signed)
Transition Care Management Unsuccessful Follow-up Telephone Call  Date of discharge and from where:  [facility] 10/23/22   Attempts:  2nd Attempt  Reason for unsuccessful TCM follow-up call:  Left voice message

## 2023-01-04 ENCOUNTER — Other Ambulatory Visit (HOSPITAL_BASED_OUTPATIENT_CLINIC_OR_DEPARTMENT_OTHER): Payer: Self-pay

## 2023-01-04 MED ORDER — PHENTERMINE HCL 37.5 MG PO TABS
37.5000 mg | ORAL_TABLET | Freq: Every morning | ORAL | 0 refills | Status: DC
  Filled 2023-01-04: qty 30, 30d supply, fill #0

## 2023-01-05 ENCOUNTER — Other Ambulatory Visit (HOSPITAL_BASED_OUTPATIENT_CLINIC_OR_DEPARTMENT_OTHER): Payer: Self-pay

## 2023-01-05 MED ORDER — OMEPRAZOLE 40 MG PO CPDR
40.0000 mg | DELAYED_RELEASE_CAPSULE | Freq: Every day | ORAL | 5 refills | Status: DC
  Filled 2023-01-05: qty 30, 30d supply, fill #0
  Filled 2023-01-31: qty 30, 30d supply, fill #1

## 2023-01-05 MED ORDER — LINZESS 145 MCG PO CAPS
145.0000 ug | ORAL_CAPSULE | Freq: Every day | ORAL | 5 refills | Status: DC
  Filled 2023-01-05: qty 30, 30d supply, fill #0
  Filled 2023-01-31: qty 30, 30d supply, fill #1

## 2023-01-21 LAB — HM PAP SMEAR

## 2023-01-22 LAB — HM MAMMOGRAPHY: HM Mammogram: NORMAL (ref 0–4)

## 2023-01-26 ENCOUNTER — Other Ambulatory Visit (HOSPITAL_BASED_OUTPATIENT_CLINIC_OR_DEPARTMENT_OTHER): Payer: Self-pay

## 2023-01-26 MED ORDER — METRONIDAZOLE 500 MG PO TABS
500.0000 mg | ORAL_TABLET | Freq: Two times a day (BID) | ORAL | 0 refills | Status: DC
  Filled 2023-01-26: qty 14, 7d supply, fill #0

## 2023-01-26 MED ORDER — PHENTERMINE HCL 37.5 MG PO TABS
37.5000 mg | ORAL_TABLET | ORAL | 0 refills | Status: DC
  Filled 2023-01-28: qty 30, 30d supply, fill #0

## 2023-01-28 ENCOUNTER — Other Ambulatory Visit (HOSPITAL_BASED_OUTPATIENT_CLINIC_OR_DEPARTMENT_OTHER): Payer: Self-pay

## 2023-02-08 ENCOUNTER — Other Ambulatory Visit (HOSPITAL_BASED_OUTPATIENT_CLINIC_OR_DEPARTMENT_OTHER): Payer: Self-pay

## 2023-02-23 ENCOUNTER — Other Ambulatory Visit (HOSPITAL_BASED_OUTPATIENT_CLINIC_OR_DEPARTMENT_OTHER): Payer: Self-pay

## 2023-02-23 ENCOUNTER — Encounter (HOSPITAL_BASED_OUTPATIENT_CLINIC_OR_DEPARTMENT_OTHER): Payer: Self-pay | Admitting: Pharmacist

## 2023-02-23 MED ORDER — WEGOVY 0.25 MG/0.5ML ~~LOC~~ SOAJ
0.2500 mg | SUBCUTANEOUS | 0 refills | Status: DC
  Filled 2023-02-23 – 2023-02-25 (×3): qty 2, 28d supply, fill #0

## 2023-02-24 ENCOUNTER — Other Ambulatory Visit: Payer: Self-pay

## 2023-02-24 ENCOUNTER — Encounter (HOSPITAL_BASED_OUTPATIENT_CLINIC_OR_DEPARTMENT_OTHER): Payer: Self-pay

## 2023-02-24 ENCOUNTER — Other Ambulatory Visit (HOSPITAL_BASED_OUTPATIENT_CLINIC_OR_DEPARTMENT_OTHER): Payer: Self-pay

## 2023-02-25 ENCOUNTER — Other Ambulatory Visit (HOSPITAL_BASED_OUTPATIENT_CLINIC_OR_DEPARTMENT_OTHER): Payer: Self-pay

## 2023-03-01 ENCOUNTER — Other Ambulatory Visit (HOSPITAL_BASED_OUTPATIENT_CLINIC_OR_DEPARTMENT_OTHER): Payer: Self-pay

## 2023-03-01 ENCOUNTER — Encounter: Payer: Self-pay | Admitting: Internal Medicine

## 2023-03-01 ENCOUNTER — Ambulatory Visit (INDEPENDENT_AMBULATORY_CARE_PROVIDER_SITE_OTHER): Payer: 59 | Admitting: Internal Medicine

## 2023-03-01 VITALS — BP 142/78 | HR 80 | Temp 98.0°F | Resp 16 | Ht 66.0 in | Wt 227.0 lb

## 2023-03-01 DIAGNOSIS — Z0001 Encounter for general adult medical examination with abnormal findings: Secondary | ICD-10-CM

## 2023-03-01 DIAGNOSIS — I10 Essential (primary) hypertension: Secondary | ICD-10-CM

## 2023-03-01 DIAGNOSIS — D500 Iron deficiency anemia secondary to blood loss (chronic): Secondary | ICD-10-CM | POA: Diagnosis not present

## 2023-03-01 DIAGNOSIS — E269 Hyperaldosteronism, unspecified: Secondary | ICD-10-CM

## 2023-03-01 DIAGNOSIS — Z0000 Encounter for general adult medical examination without abnormal findings: Secondary | ICD-10-CM

## 2023-03-01 DIAGNOSIS — Z6841 Body Mass Index (BMI) 40.0 and over, adult: Secondary | ICD-10-CM | POA: Diagnosis not present

## 2023-03-01 LAB — IBC + FERRITIN
Ferritin: 4.1 ng/mL — ABNORMAL LOW (ref 10.0–291.0)
Iron: 17 ug/dL — ABNORMAL LOW (ref 42–145)
Saturation Ratios: 3.7 % — ABNORMAL LOW (ref 20.0–50.0)
TIBC: 460.6 ug/dL — ABNORMAL HIGH (ref 250.0–450.0)
Transferrin: 329 mg/dL (ref 212.0–360.0)

## 2023-03-01 LAB — CBC WITH DIFFERENTIAL/PLATELET
Basophils Absolute: 0 10*3/uL (ref 0.0–0.1)
Basophils Relative: 0.5 % (ref 0.0–3.0)
Eosinophils Absolute: 0 10*3/uL (ref 0.0–0.7)
Eosinophils Relative: 0.4 % (ref 0.0–5.0)
HCT: 30.9 % — ABNORMAL LOW (ref 36.0–46.0)
Hemoglobin: 9.9 g/dL — ABNORMAL LOW (ref 12.0–15.0)
Lymphocytes Relative: 25.5 % (ref 12.0–46.0)
Lymphs Abs: 1.3 10*3/uL (ref 0.7–4.0)
MCHC: 32.1 g/dL (ref 30.0–36.0)
MCV: 78 fl (ref 78.0–100.0)
Monocytes Absolute: 0.7 10*3/uL (ref 0.1–1.0)
Monocytes Relative: 12.8 % — ABNORMAL HIGH (ref 3.0–12.0)
Neutro Abs: 3.1 10*3/uL (ref 1.4–7.7)
Neutrophils Relative %: 60.8 % (ref 43.0–77.0)
Platelets: 367 10*3/uL (ref 150.0–400.0)
RBC: 3.96 Mil/uL (ref 3.87–5.11)
RDW: 15.6 % — ABNORMAL HIGH (ref 11.5–15.5)
WBC: 5.1 10*3/uL (ref 4.0–10.5)

## 2023-03-01 LAB — HM MAMMOGRAPHY

## 2023-03-01 LAB — HEPATIC FUNCTION PANEL
ALT: 14 U/L (ref 0–35)
AST: 17 U/L (ref 0–37)
Albumin: 4 g/dL (ref 3.5–5.2)
Alkaline Phosphatase: 62 U/L (ref 39–117)
Bilirubin, Direct: 0.1 mg/dL (ref 0.0–0.3)
Total Bilirubin: 0.5 mg/dL (ref 0.2–1.2)
Total Protein: 6.8 g/dL (ref 6.0–8.3)

## 2023-03-01 LAB — LIPID PANEL
Cholesterol: 148 mg/dL (ref 0–200)
HDL: 49 mg/dL (ref >39.00)
LDL Cholesterol: 85 mg/dL (ref 0–99)
NonHDL: 99.27
Total CHOL/HDL Ratio: 3
Triglycerides: 71 mg/dL (ref 0.0–149.0)
VLDL: 14.2 mg/dL (ref 0.0–40.0)

## 2023-03-01 LAB — BASIC METABOLIC PANEL
BUN: 13 mg/dL (ref 6–23)
CO2: 24 mEq/L (ref 19–32)
Calcium: 9 mg/dL (ref 8.4–10.5)
Chloride: 105 mEq/L (ref 96–112)
Creatinine, Ser: 0.85 mg/dL (ref 0.40–1.20)
GFR: 82.24 mL/min (ref >60.00)
Glucose, Bld: 87 mg/dL (ref 70–99)
Potassium: 3.7 mEq/L (ref 3.5–5.1)
Sodium: 136 mEq/L (ref 135–145)

## 2023-03-01 LAB — TSH: TSH: 1.18 u[IU]/mL (ref 0.35–5.50)

## 2023-03-01 LAB — HEMOGLOBIN A1C: Hgb A1c MFr Bld: 5.9 % (ref 4.6–6.5)

## 2023-03-01 MED ORDER — SPIRONOLACTONE 25 MG PO TABS
25.0000 mg | ORAL_TABLET | Freq: Every day | ORAL | 1 refills | Status: DC
Start: 2023-03-01 — End: 2023-10-06
  Filled 2023-03-01 – 2023-04-25 (×3): qty 30, 30d supply, fill #0
  Filled 2023-05-25 – 2023-07-23 (×3): qty 30, 30d supply, fill #1
  Filled 2023-08-18: qty 30, 30d supply, fill #2
  Filled 2023-09-16: qty 30, 30d supply, fill #3

## 2023-03-01 MED ORDER — ACCRUFER 30 MG PO CAPS: 1.0000 | ORAL_CAPSULE | Freq: Two times a day (BID) | ORAL | 1 refills | Status: DC

## 2023-03-01 MED ORDER — AMLODIPINE BESYLATE 10 MG PO TABS
10.0000 mg | ORAL_TABLET | Freq: Every day | ORAL | 1 refills | Status: DC
Start: 2023-03-01 — End: 2023-08-30
  Filled 2023-03-01: qty 30, 30d supply, fill #0
  Filled 2023-03-26: qty 30, 30d supply, fill #1
  Filled 2023-04-25: qty 30, 30d supply, fill #2
  Filled 2023-05-25: qty 30, 30d supply, fill #3
  Filled 2023-06-22: qty 30, 30d supply, fill #4
  Filled 2023-07-21 – 2023-07-23 (×2): qty 30, 30d supply, fill #5

## 2023-03-01 NOTE — Progress Notes (Signed)
Subjective:  Patient ID: Michelle Kline, female    DOB: 12-Jan-1976  Age: 47 y.o. MRN: [id number]  CC: Annual Exam, Anemia, and Hypertension   HPI Michelle Kline presents for a CPX and f/up --  She is active and denies chest pain, shortness of breath, diaphoresis, or edema.  She has intentionally lost weight with [id number].  Outpatient Medications Prior to Visit  Medication Sig Dispense Refill   WEGOVY 0.25 MG/0.5ML SOAJ Inject 0.5 mLs (0.25 mg dose) into the skin once a week at 0900 for 4 doses. 2 mL 0   amLODipine (NORVASC) 5 MG tablet Take 1 tablet (5 mg total) by mouth daily. 30 tablet 0   spironolactone (ALDACTONE) 25 MG tablet Take 1 tablet (25 mg total) by mouth daily. 30 tablet 0   BD PEN NEEDLE MICRO U/F 32G X 6 MM MISC USE DAILY (Patient not taking: Reported on 03/01/2023) 100 each 1   Ferric Maltol (ACCRUFER) 30 MG CAPS Take 1 capsule by mouth in the morning and at bedtime. (Patient not taking: Reported on 03/01/2023) 180 capsule 1   linaclotide (LINZESS) 145 MCG CAPS capsule Take one capsule (145 mcg dose) by mouth daily. (Patient not taking: Reported on 03/01/2023) 30 capsule 5   metroNIDAZOLE (FLAGYL) 500 MG tablet Take 1 tablet (500 mg total) by mouth 2 (two) times daily. (Patient not taking: Reported on 03/01/2023) 14 tablet 0   omeprazole (PRILOSEC) 40 MG capsule Take 1 capsule (40 mg total) by mouth daily. (Patient not taking: Reported on 03/01/2023) 30 capsule 5   ondansetron (ZOFRAN) 4 MG tablet Take 1 tablet (4 mg total) by mouth every 8 (eight) hours as needed for nausea or vomiting. 20 tablet 0   phentermine (ADIPEX-P) 37.5 MG tablet Take 1 tablet 30 minutes before breakfast or 1-2 hours after breakfast daily, limit caffeine 30 tablet 0   SAXENDA 18 MG/3ML SOPN INJECT 3 MG INTO THE SKIN DAILY. 9 mL 1   Tdap (BOOSTRIX) 5-2.5-18.5 LF-MCG/0.5 injection Inject 0.5 mLs into the muscle once.     WEGOVY 0.25 MG/0.5ML SOAJ Inject 0.5 mLs (0.25 mg dose) into the skin once a week at 0900 for  4 doses. (Patient not taking: Reported on 03/01/2023) 2 mL 0   WEGOVY 0.5 MG/0.5ML SOAJ Inject 0.5 mg into the skin once a week. (Patient not taking: Reported on 03/01/2023) 2 mL 0   WEGOVY 1 MG/0.5ML SOAJ Inject 1 mg into the skin once a week. (Patient not taking: Reported on 03/01/2023) 2 mL 0   No facility-administered medications prior to visit.    ROS Review of Systems  Constitutional: Negative.  Negative for appetite change, diaphoresis, fatigue and unexpected weight change.  HENT: Negative.    Eyes: Negative.   Respiratory:  Negative for cough, shortness of breath and wheezing.   Cardiovascular:  Negative for chest pain, palpitations and leg swelling.  Gastrointestinal:  Negative for abdominal pain, constipation, diarrhea, nausea and vomiting.  Endocrine: Negative.   Genitourinary: Negative.  Negative for difficulty urinating.  Musculoskeletal: Negative.  Negative for arthralgias.  Skin: Negative.  Negative for color change.  Neurological: Negative.  Negative for dizziness and weakness.  Hematological:  Negative for adenopathy. Does not bruise/bleed easily.  Psychiatric/Behavioral: Negative.      Objective:  BP (!) 142/78 (BP Location: Left Arm, Patient Position: Sitting, Cuff Size: Normal)   Pulse 80   Temp 98 F (36.7 C) (Oral)   Resp 16   Ht  (1.676 m)   Wt 227  lb (103 kg)   LMP 01/22/2023 (Exact Date) Comment: BTL  SpO2 99%   BMI 36.64 kg/m   BP Readings from Last 3 Encounters:  03/01/23 (!) 142/78  03/25/21 138/88  02/11/21 (!) 178/96    Wt Readings from Last 3 Encounters:  03/01/23 227 lb (103 kg)  03/25/21 255 lb (115.7 kg)  02/11/21 263 lb (119.3 kg)    Physical Exam Vitals reviewed.  Constitutional:      Appearance: Normal appearance.  HENT:     Nose: Nose normal.     Mouth/Throat:     Mouth: Mucous membranes are moist.  Eyes:     General: No scleral icterus.    Conjunctiva/sclera: Conjunctivae normal.  Cardiovascular:     Rate and  Rhythm: Normal rate and regular rhythm.     Heart sounds: No murmur heard. Pulmonary:     Effort: Pulmonary effort is normal.     Breath sounds: No stridor. No wheezing, rhonchi or rales.  Abdominal:     General: Abdomen is flat.     Palpations: There is no mass.     Tenderness: There is no abdominal tenderness. There is no guarding.     Hernia: No hernia is present.  Musculoskeletal:        General: Normal range of motion.     Cervical back: Neck supple.     Right lower leg: No edema.     Left lower leg: No edema.  Lymphadenopathy:     Cervical: No cervical adenopathy.  Skin:    General: Skin is warm and dry.     Coloration: Skin is not pale.  Neurological:     General: No focal deficit present.     Mental Status: She is alert. Mental status is at baseline.  Psychiatric:        Mood and Affect: Mood normal.        Behavior: Behavior normal.     Lab Results  Component Value Date   WBC 5.1 03/01/2023   HGB 9.9 (L) 03/01/2023   HCT 30.9 (L) 03/01/2023   PLT 367.0 03/01/2023   GLUCOSE 87 03/01/2023   CHOL 148 03/01/2023   TRIG 71.0 03/01/2023   HDL 49.00 03/01/2023   LDLCALC 85 03/01/2023   ALT 14 03/01/2023   AST 17 03/01/2023   NA 136 03/01/2023   K 3.7 03/01/2023   CL 105 03/01/2023   CREATININE 0.85 03/01/2023   BUN 13 03/01/2023   CO2 24 03/01/2023   TSH 1.18 03/01/2023   HGBA1C 5.9 03/01/2023    MM 3D SCREEN BREAST BILATERAL  Result Date: 03/04/2021 CLINICAL DATA:  Screening. EXAM: DIGITAL SCREENING BILATERAL MAMMOGRAM WITH TOMOSYNTHESIS AND CAD TECHNIQUE: Bilateral screening digital craniocaudal and mediolateral oblique mammograms were obtained. Bilateral screening digital breast tomosynthesis was performed. The images were evaluated with computer-aided detection. COMPARISON:  Previous exam(s). ACR Breast Density Category b: There are scattered areas of fibroglandular density. FINDINGS: In the right breast an asymmetry requires further evaluation. In the left  breast and asymmetry requires further evaluation. IMPRESSION: Further evaluation is suggested for possible asymmetry in the right breast. Further evaluation is suggested for possible asymmetry in the left breast. RECOMMENDATION: Diagnostic mammogram and possibly ultrasound of both breasts. (Code:FI-B-51M) The patient will be contacted regarding the findings, and additional imaging will be scheduled. BI-RADS CATEGORY  0: Incomplete. Need additional imaging evaluation and/or prior mammograms for comparison. Electronically Signed   By: Gerome Sam III M.D   On: 03/04/2021 14:35  Assessment & Plan:   Iron deficiency anemia due to chronic blood loss- This is caused by menorrhagia.  Will treat with an iron supplement. -     IBC + Ferritin; Future -     CBC with Differential/Platelet; Future -     ACCRUFeR; Take 1 capsule (30 mg total) by mouth in the morning and at bedtime.  Dispense: 180 capsule; Refill: 1  Essential hypertension- She has not achieved her blood pressure goal.  Will increase the dose of amlodipine. -     TSH; Future -     Basic metabolic panel; Future -     amLODIPine Besylate; Take 1 tablet (10 mg total) by mouth daily.  Dispense: 90 tablet; Refill: 1 -     Spironolactone; Take 1 tablet (25 mg total) by mouth daily.  Dispense: 90 tablet; Refill: 1  Hyperaldosteronism- She is doing well on spironolactone. -     Basic metabolic panel; Future -     Spironolactone; Take 1 tablet (25 mg total) by mouth daily.  Dispense: 90 tablet; Refill: 1  Class 3 severe obesity due to excess calories with serious comorbidity and body mass index (BMI) of 40.0 to 44.9 in adult- Improvement noted.  She was praised for her lifestyle modifications. -     TSH; Future -     Hepatic function panel; Future -     Hemoglobin A1c; Future -     Basic metabolic panel; Future  Encounter for general adult medical examination with abnormal findings- Exam completed, labs reviewed, vaccines reviewed, cancer  screenings are up-to-date, patient education was given. -     Lipid panel; Future     Follow-up: Return in about 6 months (around 08/31/2023).  Sanda Linger, MD

## 2023-03-01 NOTE — Patient Instructions (Signed)

## 2023-03-16 ENCOUNTER — Other Ambulatory Visit (HOSPITAL_BASED_OUTPATIENT_CLINIC_OR_DEPARTMENT_OTHER): Payer: Self-pay

## 2023-03-16 MED ORDER — WEGOVY 0.5 MG/0.5ML ~~LOC~~ SOAJ
0.5000 mg | SUBCUTANEOUS | 0 refills | Status: DC
  Filled 2023-03-16: qty 2, 28d supply, fill #0

## 2023-03-17 ENCOUNTER — Other Ambulatory Visit (HOSPITAL_BASED_OUTPATIENT_CLINIC_OR_DEPARTMENT_OTHER): Payer: Self-pay

## 2023-03-26 ENCOUNTER — Other Ambulatory Visit (HOSPITAL_BASED_OUTPATIENT_CLINIC_OR_DEPARTMENT_OTHER): Payer: Self-pay

## 2023-03-26 ENCOUNTER — Other Ambulatory Visit: Payer: Self-pay

## 2023-04-13 ENCOUNTER — Other Ambulatory Visit (HOSPITAL_BASED_OUTPATIENT_CLINIC_OR_DEPARTMENT_OTHER): Payer: Self-pay

## 2023-04-13 MED ORDER — WEGOVY 1 MG/0.5ML ~~LOC~~ SOAJ
1.0000 mg | SUBCUTANEOUS | 0 refills | Status: DC
  Filled 2023-04-13: qty 2, 28d supply, fill #0

## 2023-04-25 ENCOUNTER — Other Ambulatory Visit (HOSPITAL_BASED_OUTPATIENT_CLINIC_OR_DEPARTMENT_OTHER): Payer: Self-pay

## 2023-05-13 ENCOUNTER — Other Ambulatory Visit (HOSPITAL_BASED_OUTPATIENT_CLINIC_OR_DEPARTMENT_OTHER): Payer: Self-pay

## 2023-05-13 MED ORDER — WEGOVY 1 MG/0.5ML ~~LOC~~ SOAJ
1.0000 mg | SUBCUTANEOUS | 0 refills | Status: DC
  Filled 2023-05-13: qty 2, 28d supply, fill #0

## 2023-05-25 ENCOUNTER — Other Ambulatory Visit: Payer: Self-pay

## 2023-07-21 ENCOUNTER — Other Ambulatory Visit: Payer: Self-pay

## 2023-07-22 ENCOUNTER — Encounter (HOSPITAL_COMMUNITY): Payer: Self-pay

## 2023-07-22 ENCOUNTER — Other Ambulatory Visit: Payer: Self-pay

## 2023-07-23 ENCOUNTER — Other Ambulatory Visit (HOSPITAL_COMMUNITY): Payer: Self-pay

## 2023-07-23 ENCOUNTER — Other Ambulatory Visit: Payer: Self-pay

## 2023-07-23 ENCOUNTER — Other Ambulatory Visit (HOSPITAL_BASED_OUTPATIENT_CLINIC_OR_DEPARTMENT_OTHER): Payer: Self-pay

## 2023-08-18 ENCOUNTER — Other Ambulatory Visit: Payer: Self-pay

## 2023-08-18 ENCOUNTER — Other Ambulatory Visit: Payer: Self-pay | Admitting: Internal Medicine

## 2023-08-18 DIAGNOSIS — I10 Essential (primary) hypertension: Secondary | ICD-10-CM

## 2023-08-21 ENCOUNTER — Encounter (HOSPITAL_BASED_OUTPATIENT_CLINIC_OR_DEPARTMENT_OTHER): Payer: Self-pay | Admitting: Pharmacist

## 2023-08-21 ENCOUNTER — Other Ambulatory Visit (HOSPITAL_BASED_OUTPATIENT_CLINIC_OR_DEPARTMENT_OTHER): Payer: Self-pay

## 2023-08-27 ENCOUNTER — Encounter: Payer: Self-pay | Admitting: Internal Medicine

## 2023-08-30 ENCOUNTER — Other Ambulatory Visit: Payer: Self-pay | Admitting: Internal Medicine

## 2023-08-30 DIAGNOSIS — I10 Essential (primary) hypertension: Secondary | ICD-10-CM

## 2023-08-30 MED ORDER — AMLODIPINE BESYLATE 10 MG PO TABS
10.0000 mg | ORAL_TABLET | Freq: Every day | ORAL | 0 refills | Status: DC
Start: 2023-08-30 — End: 2023-09-28

## 2023-09-21 ENCOUNTER — Other Ambulatory Visit: Payer: Self-pay | Admitting: Internal Medicine

## 2023-09-21 DIAGNOSIS — I10 Essential (primary) hypertension: Secondary | ICD-10-CM

## 2023-09-27 ENCOUNTER — Other Ambulatory Visit: Payer: Self-pay | Admitting: Internal Medicine

## 2023-09-27 DIAGNOSIS — I10 Essential (primary) hypertension: Secondary | ICD-10-CM

## 2023-09-28 ENCOUNTER — Encounter: Payer: Self-pay | Admitting: Internal Medicine

## 2023-09-28 ENCOUNTER — Other Ambulatory Visit: Payer: Self-pay

## 2023-09-28 DIAGNOSIS — I10 Essential (primary) hypertension: Secondary | ICD-10-CM

## 2023-09-28 MED ORDER — AMLODIPINE BESYLATE 10 MG PO TABS
10.0000 mg | ORAL_TABLET | Freq: Every day | ORAL | 0 refills | Status: DC
Start: 2023-09-28 — End: 2023-10-05

## 2023-10-05 ENCOUNTER — Ambulatory Visit: Payer: 59 | Admitting: Internal Medicine

## 2023-10-05 ENCOUNTER — Encounter: Payer: Self-pay | Admitting: Internal Medicine

## 2023-10-05 ENCOUNTER — Other Ambulatory Visit: Payer: Self-pay

## 2023-10-05 VITALS — BP 134/86 | HR 76 | Temp 98.5°F | Resp 16 | Ht 66.0 in | Wt 245.8 lb

## 2023-10-05 DIAGNOSIS — I10 Essential (primary) hypertension: Secondary | ICD-10-CM | POA: Diagnosis not present

## 2023-10-05 DIAGNOSIS — D500 Iron deficiency anemia secondary to blood loss (chronic): Secondary | ICD-10-CM | POA: Diagnosis not present

## 2023-10-05 DIAGNOSIS — E269 Hyperaldosteronism, unspecified: Secondary | ICD-10-CM | POA: Diagnosis not present

## 2023-10-05 LAB — CBC WITH DIFFERENTIAL/PLATELET
Basophils Absolute: 0 10*3/uL (ref 0.0–0.1)
Basophils Relative: 0.7 % (ref 0.0–3.0)
Eosinophils Absolute: 0.1 10*3/uL (ref 0.0–0.7)
Eosinophils Relative: 1.4 % (ref 0.0–5.0)
HCT: 35.9 % — ABNORMAL LOW (ref 36.0–46.0)
Hemoglobin: 11.3 g/dL — ABNORMAL LOW (ref 12.0–15.0)
Lymphocytes Relative: 20.3 % (ref 12.0–46.0)
Lymphs Abs: 1 10*3/uL (ref 0.7–4.0)
MCHC: 31.5 g/dL (ref 30.0–36.0)
MCV: 84 fL (ref 78.0–100.0)
Monocytes Absolute: 0.6 10*3/uL (ref 0.1–1.0)
Monocytes Relative: 12.3 % — ABNORMAL HIGH (ref 3.0–12.0)
Neutro Abs: 3.2 10*3/uL (ref 1.4–7.7)
Neutrophils Relative %: 65.3 % (ref 43.0–77.0)
Platelets: 368 10*3/uL (ref 150.0–400.0)
RBC: 4.27 Mil/uL (ref 3.87–5.11)
RDW: 17.2 % — ABNORMAL HIGH (ref 11.5–15.5)
WBC: 5 10*3/uL (ref 4.0–10.5)

## 2023-10-05 LAB — BASIC METABOLIC PANEL
BUN: 12 mg/dL (ref 6–23)
CO2: 27 meq/L (ref 19–32)
Calcium: 9.2 mg/dL (ref 8.4–10.5)
Chloride: 105 meq/L (ref 96–112)
Creatinine, Ser: 0.95 mg/dL (ref 0.40–1.20)
GFR: 71.67 mL/min (ref >60.00)
Glucose, Bld: 103 mg/dL — ABNORMAL HIGH (ref 70–99)
Potassium: 4.5 meq/L (ref 3.5–5.1)
Sodium: 138 meq/L (ref 135–145)

## 2023-10-05 LAB — IBC + FERRITIN
Ferritin: 5.6 ng/mL — ABNORMAL LOW (ref 10.0–291.0)
Iron: 133 ug/dL (ref 42–145)
Saturation Ratios: 27 % (ref 20.0–50.0)
TIBC: 492.8 ug/dL — ABNORMAL HIGH (ref 250.0–450.0)
Transferrin: 352 mg/dL (ref 212.0–360.0)

## 2023-10-05 MED ORDER — AMLODIPINE BESYLATE 10 MG PO TABS: 10.0000 mg | ORAL_TABLET | Freq: Every day | ORAL | 1 refills | Status: DC

## 2023-10-05 MED ORDER — ACCRUFER 30 MG PO CAPS: 1.0000 | ORAL_CAPSULE | Freq: Two times a day (BID) | ORAL | 0 refills | Status: AC

## 2023-10-05 NOTE — Patient Instructions (Signed)
Iron Deficiency Anemia, Adult  Iron deficiency anemia is a condition in which the concentration of red blood cells or hemoglobin in the blood is below normal because of too little iron. Hemoglobin is a substance in red blood cells that carries oxygen to the body's tissues. When the concentration of red blood cells or hemoglobin is too low, not enough oxygen reaches these tissues. Iron deficiency anemia is usually long-lasting, and it develops over time. It may or may not cause symptoms. It is a common type of anemia. What are the causes? This condition may be caused by: Not enough iron in the diet. Abnormal absorption in the gut. Blood loss. What increases the risk? You are more likely to develop this condition if you get menstrual periods (menstruate) or are pregnant. What are the signs or symptoms? Symptoms of this condition may include: Pale skin, lips, and nail beds. Weakness, dizziness, and getting tired easily. Shortness of breath when moving or exercising. Cold hands or feet. Mild anemia may not cause any symptoms. How is this diagnosed? This condition is diagnosed based on: Your medical history. A physical exam. Blood tests. How is this treated? This condition is treated by correcting the cause of your iron deficiency. Treatment may involve: Adding iron-rich foods to your diet. Taking iron supplements. If you are pregnant or breastfeeding, you may need to take extra iron because your normal diet usually does not provide the amount of iron that you need. Increasing vitamin C intake. Vitamin C helps your body absorb iron. Your health care provider may recommend that you take iron supplements along with a glass of orange juice or a vitamin C supplement. Medicines to make heavy menstrual flow lighter. Surgery or additional testing procedures to determine the cause of your anemia. You may need repeat blood tests to determine whether treatment is working. If the treatment does not  seem to be working, you may need more tests. Follow these instructions at home: Medicines Take over-the-counter and prescription medicines only as told by your health care provider. This includes iron supplements and vitamins. This is important because too much iron can be harmful. For the best iron absorption, you should take iron supplements when your stomach is empty. If you cannot tolerate them on an empty stomach, you may need to take them with food. Do not drink milk or take antacids at the same time as your iron supplements. Milk and antacids may interfere with how your body absorbs iron. Iron supplements may turn stool (feces) a darker color and it may appear black. If you cannot tolerate taking iron supplements by mouth, talk with your health care provider about taking them through an IV or through an injection into a muscle. Eating and drinking Talk with your health care provider before changing your diet. Your provider may recommend that you eat foods that contain a lot of iron, such as: Liver. Low-fat (lean) beef. Breads and cereals that have iron added to them (are fortified). Eggs. Dried fruit. Dark green, leafy vegetables. To help your body use the iron from iron-rich foods, eat those foods at the same time as fresh fruits and vegetables that are high in vitamin C. Foods that are high in vitamin C include: Oranges. Peppers. Tomatoes. Mangoes. Managing constipation If you are taking an iron supplement, it may cause constipation. To prevent or treat constipation, you may need to: Drink enough fluid to keep your urine pale yellow. Take over-the-counter or prescription medicines. Eat foods that are high in fiber, such   as beans, whole grains, and fresh fruits and vegetables. Limit foods that are high in fat and processed sugars, such as fried or sweet foods. General instructions Return to your normal activities as told by your health care provider. Ask your health care provider  what activities are safe for you. Keep all follow-up visits. Contact a health care provider if: You feel nauseous or you vomit. You feel weak. You become light-headed when getting up from a sitting or lying down position. You have unexplained sweating. You develop symptoms of constipation. You have a heaviness in your chest. You have trouble breathing with physical activity. Get help right away if: You faint. If this happens, do not drive yourself to the hospital. You have an irregular or rapid heartbeat. Summary Iron deficiency anemia is a condition in which the concentration of red blood cells or hemoglobin in the blood is below normal because of too little iron. This condition is treated by correcting the cause of your iron deficiency. Take over-the-counter and prescription medicines only as told by your health care provider. This includes iron supplements and vitamins. To help your body use the iron from iron-rich foods, eat those foods at the same time as fresh fruits and vegetables that are high in vitamin C. Seek medical help if you have signs or symptoms of worsening anemia. This information is not intended to replace advice given to you by your health care provider. Make sure you discuss any questions you have with your health care provider. Document Revised: 12/10/2021 Document Reviewed: 12/10/2021 Elsevier Patient Education  2024 [employer] Inc[employer]  

## 2023-10-05 NOTE — Progress Notes (Unsigned)
Subjective:  Patient ID: Michelle Kline, female    DOB: [birth year]  Age: 47 y.o. MRN: [id number]  CC: Anemia and Hypertension   HPI [name] presents for f/up ----  Discussed the use of AI scribe software for clinical note transcription with the patient, who gave verbal consent to proceed.  History of Present Illness   The patient, with a history of anemia, presents with occasional lightheadedness. She admits to inconsistent adherence to her iron medication which she takes once daily, often forgetting the evening dose. The patient attributes her anemia to heavy and prolonged menstrual cycles, suspected to be due to fibroids as per her OBGYN. Her last menstrual cycle started on November 10th. She has undergone tubal ligation and denies any chance of pregnancy.  The patient denies experiencing chest pain, shortness of breath during activity, or swelling in her legs or feet. She has received her flu and COVID vaccines approximately two months ago. She is currently on amlodipine and spironolactone for blood pressure management, which are well controlled. The patient plans to schedule an appointment with her OBGYN for further evaluation of her heavy menstrual cycles and suspected fibroids.       Outpatient Medications Prior to Visit  Medication Sig Dispense Refill   spironolactone (ALDACTONE) 25 MG tablet Take 1 tablet (25 mg total) by mouth daily. 90 tablet 1   amLODipine (NORVASC) 10 MG tablet Take 1 tablet (10 mg total) by mouth daily. 5 tablet 0   Ferric Maltol (ACCRUFER) 30 MG CAPS Take 1 capsule (30 mg total) by mouth in the morning and at bedtime. 180 capsule 1   BD PEN NEEDLE MICRO U/F 32G X 6 MM MISC USE DAILY 100 each 1   WEGOVY 0.25 MG/0.5ML SOAJ Inject 0.5 mLs (0.25 mg dose) into the skin once a week at 0900 for 4 doses. 2 mL 0   WEGOVY 0.5 MG/0.5ML SOAJ Inject 0.5 mg into the skin once a week. 2 mL 0   WEGOVY 1 MG/0.5ML SOAJ Inject 1 mg into the skin once a week. 2 mL 0   No  facility-administered medications prior to visit.    ROS Review of Systems  Objective:  BP 134/86 (BP Location: Left Arm, Patient Position: Sitting, Cuff Size: Normal)   Pulse 76   Temp 98.5 F (36.9 C) (Oral)   Resp 16   Ht 5\' 6"  (1.676 m)   Wt 245 lb 12.8 oz (111.5 kg)   LMP 09/26/2023 (Exact Date) Comment: BTL  SpO2 98%   BMI 39.67 kg/m   BP Readings from Last 3 Encounters:  10/05/23 134/86  03/01/23 (!) 142/78  03/25/21 138/88    Wt Readings from Last 3 Encounters:  10/05/23 245 lb 12.8 oz (111.5 kg)  03/01/23 227 lb (103 kg)  03/25/21 255 lb (115.7 kg)    Physical Exam  Lab Results  Component Value Date   WBC 5.0 10/05/2023   HGB 11.3 (L) 10/05/2023   HCT 35.9 (L) 10/05/2023   PLT 368.0 10/05/2023   GLUCOSE 103 (H) 10/05/2023   CHOL 148 03/01/2023   TRIG 71.0 03/01/2023   HDL 49.00 03/01/2023   LDLCALC 85 03/01/2023   ALT 14 03/01/2023   AST 17 03/01/2023   NA 138 10/05/2023   K 4.5 10/05/2023   CL 105 10/05/2023   CREATININE 0.95 10/05/2023   BUN 12 10/05/2023   CO2 27 10/05/2023   TSH 1.18 03/01/2023   HGBA1C 5.9 03/01/2023    MM 3D SCREEN BREAST BILATERAL  Result Date: 03/04/2021 CLINICAL DATA:  Screening. EXAM: DIGITAL SCREENING BILATERAL MAMMOGRAM WITH TOMOSYNTHESIS AND CAD TECHNIQUE: Bilateral screening digital craniocaudal and mediolateral oblique mammograms were obtained. Bilateral screening digital breast tomosynthesis was performed. The images were evaluated with computer-aided detection. COMPARISON:  Previous exam(s). ACR Breast Density Category b: There are scattered areas of fibroglandular density. FINDINGS: In the right breast an asymmetry requires further evaluation. In the left breast and asymmetry requires further evaluation. IMPRESSION: Further evaluation is suggested for possible asymmetry in the right breast. Further evaluation is suggested for possible asymmetry in the left breast. RECOMMENDATION: Diagnostic mammogram and possibly  ultrasound of both breasts. (Code:FI-B-58M) The patient will be contacted regarding the findings, and additional imaging will be scheduled. BI-RADS CATEGORY  0: Incomplete. Need additional imaging evaluation and/or prior mammograms for comparison. Electronically Signed   By: Gerome Sam III M.D   On: 03/04/2021 14:35    Assessment & Plan:  Essential hypertension -     Basic metabolic panel; Future  Hyperaldosteronism (HCC) -     Basic metabolic panel; Future  Iron deficiency anemia due to chronic blood loss -     IBC + Ferritin; Future -     CBC with Differential/Platelet; Future -     ACCRUFeR; Take 1 capsule (30 mg total) by mouth in the morning and at bedtime.  Dispense: 180 capsule; Refill: 0     Follow-up: Return in about 6 months (around 04/03/2024).  Sanda Linger, MD

## 2023-10-06 MED ORDER — AMLODIPINE BESYLATE 10 MG PO TABS
10.0000 mg | ORAL_TABLET | Freq: Every day | ORAL | 1 refills | Status: DC
Start: 2023-10-06 — End: 2024-04-11

## 2023-10-06 MED ORDER — SPIRONOLACTONE 25 MG PO TABS: 25.0000 mg | ORAL_TABLET | Freq: Every day | ORAL | 1 refills | Status: DC

## 2023-10-07 ENCOUNTER — Telehealth: Payer: 59 | Admitting: Physician Assistant

## 2023-10-07 DIAGNOSIS — R21 Rash and other nonspecific skin eruption: Secondary | ICD-10-CM

## 2023-10-07 MED ORDER — METHYLPREDNISOLONE 4 MG PO TBPK: ORAL_TABLET | ORAL | 0 refills | Status: AC

## 2023-10-07 NOTE — Patient Instructions (Signed)
  Michelle Kline, thank you for joining Gilberto Better, PA-C for today's virtual visit.  While this provider is not your primary care provider (PCP), if your PCP is located in our provider database this encounter information will be shared with them immediately following your visit.   A [facility] MyChart account gives you access to today's visit and all your visits, tests, and labs performed at [facility] " click here if you don't have a [facility] MyChart account or go to [email or web address]  Consent: (Patient) Michelle Kline provided verbal consent for this virtual visit at the beginning of the encounter.  Current Medications:  Current Outpatient Medications:    methylPREDNISolone (MEDROL DOSEPAK) 4 MG TBPK tablet, Take as directed, Disp: 1 each, Rfl: 0   amLODipine (NORVASC) 10 MG tablet, Take 1 tablet (10 mg total) by mouth daily., Disp: 90 tablet, Rfl: 1   Ferric Maltol (ACCRUFER) 30 MG CAPS, Take 1 capsule (30 mg total) by mouth in the morning and at bedtime., Disp: 180 capsule, Rfl: 0   spironolactone (ALDACTONE) 25 MG tablet, Take 1 tablet (25 mg total) by mouth daily., Disp: 90 tablet, Rfl: 1   Medications ordered in this encounter:  Meds ordered this encounter  Medications   methylPREDNISolone (MEDROL DOSEPAK) 4 MG TBPK tablet    Sig: Take as directed    Dispense:  1 each    Refill:  0    Order Specific Question:   Supervising Provider    Answer:   Merrilee Jansky [id number]     *If you need refills on other medications prior to your next appointment, please contact your pharmacy*  Follow-Up: Call back or seek an in-person evaluation if the symptoms worsen or if the condition fails to improve as anticipated.  [facility] [phone]  Other Instructions Apply ice.  Take Benadryl at bedtime Take Zyrtec during the day. If symptoms don't improve then consider taking oral steroids if needed.   If you have been instructed to have an in-person  evaluation today at a local Urgent Care facility, please use the link below. It will take you to a list of all of our available [facility], including address, phone number and hours of operation. Please do not delay care.  [facility]  If you or a family member do not have a primary care provider, use the link below to schedule a visit and establish care. When you choose a [facility] primary care physician or advanced practice provider, you gain a long-term partner in health. Find a Primary Care Provider  Learn more about [facility]'s in-office and virtual care options: [facility] - Get Care Now

## 2023-10-07 NOTE — Progress Notes (Signed)
Virtual Visit Consent   Michelle Kline, you are scheduled for a virtual visit with a [facility] provider today. Just as with appointments in the office, your consent must be obtained to participate. Your consent will be active for this visit and any virtual visit you may have with one of our providers in the next 365 days. If you have a MyChart account, a copy of this consent can be sent to you electronically.  As this is a virtual visit, video technology does not allow for your provider to perform a traditional examination. This may limit your provider's ability to fully assess your condition. If your provider identifies any concerns that need to be evaluated in person or the need to arrange testing (such as labs, EKG, etc.), we will make arrangements to do so. Although advances in technology are sophisticated, we cannot ensure that it will always work on either your end or our end. If the connection with a video visit is poor, the visit may have to be switched to a telephone visit. With either a video or telephone visit, we are not always able to ensure that we have a secure connection.  By engaging in this virtual visit, you consent to the provision of healthcare and authorize for your insurance to be billed (if applicable) for the services provided during this visit. Depending on your insurance coverage, you may receive a charge related to this service.  I need to obtain your verbal consent now. Are you willing to proceed with your visit today? Michelle Kline has provided verbal consent on 10/07/2023 for a virtual visit (video or telephone). Gilberto Better, [place]  Date: 10/07/2023 3:24 PM  Virtual Visit via Video Note   I, Michelle Kline, connected with  Michelle Kline  ([id number], [birth year]) on 10/07/23 at  3:15 PM EST by a video-enabled telemedicine application and verified that I am speaking with the correct person using two identifiers.  Location: Patient: Virtual Visit Location Patient: Home Provider:  Virtual Visit Location Provider: Home Office   I discussed the limitations of evaluation and management by telemedicine and the availability of in person appointments. The patient expressed understanding and agreed to proceed.    History of Present Illness: Michelle Kline is a 47 y.o. who identifies as a female who was assigned female at birth, and is being seen today for rash.  HPI: 47 y/o F presents via telehealth for c/o rash on both arms x1week and progressively getting worse. +itching. Has changed laundry detergent. NO changes in diet or hygiene products. Hasn't taken any otc medicine for her rash.   Rash    Problems:  Patient Active Problem List   Diagnosis Date Noted   Iron deficiency anemia due to chronic blood loss 03/26/2021   Hyperaldosteronism (HCC) 02/25/2021   Class 3 severe obesity due to excess calories with serious comorbidity and body mass index (BMI) of 40.0 to 44.9 in adult [facility]) 02/11/2021   Encounter for general adult medical examination with abnormal findings 09/11/2016   Essential hypertension 01/13/2007    Allergies: No Known Allergies Medications:  Current Outpatient Medications:    methylPREDNISolone (MEDROL DOSEPAK) 4 MG TBPK tablet, Take as directed, Disp: 1 each, Rfl: 0   amLODipine (NORVASC) 10 MG tablet, Take 1 tablet (10 mg total) by mouth daily., Disp: 90 tablet, Rfl: 1   Ferric Maltol (ACCRUFER) 30 MG CAPS, Take 1 capsule (30 mg total) by mouth in the morning and at bedtime., Disp: 180 capsule, Rfl: 0   spironolactone (ALDACTONE)  25 MG tablet, Take 1 tablet (25 mg total) by mouth daily., Disp: 90 tablet, Rfl: 1  Observations/Objective: Patient is well-developed, well-nourished in no acute distress.  Resting comfortably  at home.  Head is normocephalic, atraumatic.  No labored breathing.  Speech is clear and coherent with logical content.  Patient is alert and oriented at baseline.    Assessment and Plan: 1. Rash and other nonspecific skin  eruption - methylPREDNISolone (MEDROL DOSEPAK) 4 MG TBPK tablet; Take as directed  Dispense: 1 each; Refill: 0  Apply ice.  Take Benadryl at bedtime Take Zyrtec during the day. If symptoms don't improve then consider taking oral steroids if needed. Pt verbalized understanding and in agreement.    Follow Up Instructions: I discussed the assessment and treatment plan with the patient. The patient was provided an opportunity to ask questions and all were answered. The patient agreed with the plan and demonstrated an understanding of the instructions.  A copy of instructions were sent to the patient via MyChart unless otherwise noted below.   Patient has requested to receive PHI (AVS, Work Notes, etc) pertaining to this video visit through e-mail as they are currently without active MyChart. They have voiced understand that email is not considered secure and their health information could be viewed by someone other than the patient.   The patient was advised to call back or seek an in-person evaluation if the symptoms worsen or if the condition fails to improve as anticipated.    Gilberto Better, PA-C

## 2023-10-27 ENCOUNTER — Ambulatory Visit: Payer: 59 | Admitting: Internal Medicine

## 2023-12-07 ENCOUNTER — Other Ambulatory Visit (HOSPITAL_BASED_OUTPATIENT_CLINIC_OR_DEPARTMENT_OTHER): Payer: Self-pay

## 2023-12-07 MED ORDER — WEGOVY 0.25 MG/0.5ML ~~LOC~~ SOAJ
0.2500 mg | SUBCUTANEOUS | 0 refills | Status: AC
  Filled 2023-12-07: qty 2, 28d supply, fill #0

## 2023-12-08 ENCOUNTER — Other Ambulatory Visit (HOSPITAL_BASED_OUTPATIENT_CLINIC_OR_DEPARTMENT_OTHER): Payer: Self-pay

## 2023-12-08 ENCOUNTER — Other Ambulatory Visit (HOSPITAL_COMMUNITY): Payer: Self-pay

## 2023-12-29 ENCOUNTER — Other Ambulatory Visit (HOSPITAL_BASED_OUTPATIENT_CLINIC_OR_DEPARTMENT_OTHER): Payer: Self-pay

## 2023-12-29 MED ORDER — WEGOVY 0.5 MG/0.5ML ~~LOC~~ SOAJ
0.5000 mg | SUBCUTANEOUS | 0 refills | Status: DC
Start: 2023-12-29 — End: 2024-01-20
  Filled 2023-12-29: qty 2, 28d supply, fill #0

## 2023-12-30 ENCOUNTER — Other Ambulatory Visit (HOSPITAL_BASED_OUTPATIENT_CLINIC_OR_DEPARTMENT_OTHER): Payer: Self-pay

## 2024-01-20 ENCOUNTER — Other Ambulatory Visit (HOSPITAL_BASED_OUTPATIENT_CLINIC_OR_DEPARTMENT_OTHER): Payer: Self-pay

## 2024-01-20 MED ORDER — WEGOVY 1 MG/0.5ML ~~LOC~~ SOAJ
1.0000 mg | SUBCUTANEOUS | 0 refills | Status: AC
  Filled 2024-01-20: qty 2, 28d supply, fill #0

## 2024-01-21 ENCOUNTER — Other Ambulatory Visit (HOSPITAL_BASED_OUTPATIENT_CLINIC_OR_DEPARTMENT_OTHER): Payer: Self-pay

## 2024-02-08 ENCOUNTER — Other Ambulatory Visit: Payer: Self-pay | Admitting: Internal Medicine

## 2024-02-08 DIAGNOSIS — I10 Essential (primary) hypertension: Secondary | ICD-10-CM

## 2024-02-08 DIAGNOSIS — E269 Hyperaldosteronism, unspecified: Secondary | ICD-10-CM

## 2024-02-16 ENCOUNTER — Telehealth: Admitting: Urgent Care

## 2024-02-16 DIAGNOSIS — U071 COVID-19: Secondary | ICD-10-CM

## 2024-02-16 MED ORDER — NIRMATRELVIR/RITONAVIR (PAXLOVID)TABLET: 3.0000 | ORAL_TABLET | Freq: Two times a day (BID) | ORAL | 0 refills | Status: AC

## 2024-02-16 NOTE — Patient Instructions (Signed)
 Michelle Kline, thank you for joining [name], PA for today's virtual visit.  While this provider is not your primary care provider (PCP), if your PCP is located in our provider database this encounter information will be shared with them immediately following your visit.   A [facility] MyChart account gives you access to today's visit and all your visits, tests, and labs performed at [facility] " click here if you don't have a [facility] MyChart account or go to [email or web address]  Consent: (Patient) Michelle Kline provided verbal consent for this virtual visit at the beginning of the encounter.  Current Medications:  Current Outpatient Medications:    nirmatrelvir/ritonavir (PAXLOVID) 20 x 150 MG & 10 x 100MG  TABS, Take 3 tablets by mouth 2 (two) times daily for 5 days. (Take nirmatrelvir 150 mg two tablets twice daily for 5 days and ritonavir 100 mg one tablet twice daily for 5 days) Patient GFR is 74, Disp: 30 tablet, Rfl: 0   amLODipine (NORVASC) 10 MG tablet, Take 1 tablet (10 mg total) by mouth daily., Disp: 90 tablet, Rfl: 1   Ferric Maltol (ACCRUFER) 30 MG CAPS, Take 1 capsule (30 mg total) by mouth in the morning and at bedtime., Disp: 180 capsule, Rfl: 0   methylPREDNISolone (MEDROL DOSEPAK) 4 MG TBPK tablet, Take as directed, Disp: 1 each, Rfl: 0   spironolactone (ALDACTONE) 25 MG tablet, Take 1 tablet (25 mg total) by mouth daily. Schedule an appt for further refills, Disp: 60 tablet, Rfl: 0   WEGOVY 0.25 MG/0.5ML SOAJ, Inject 0.25 mg into the skin every 7 (seven) days at 9 am, Disp: 2 mL, Rfl: 0   WEGOVY 1 MG/0.5ML SOAJ, Inject 1 mg into the skin every 7 (seven) days., Disp: 2 mL, Rfl: 0   Medications ordered in this encounter:  Meds ordered this encounter  Medications   nirmatrelvir/ritonavir (PAXLOVID) 20 x 150 MG & 10 x 100MG  TABS    Sig: Take 3 tablets by mouth 2 (two) times daily for 5 days. (Take nirmatrelvir 150 mg two tablets twice daily for 5  days and ritonavir 100 mg one tablet twice daily for 5 days) Patient GFR is 74    Dispense:  30 tablet    Refill:  0     *If you need refills on other medications prior to your next appointment, please contact your pharmacy*  Follow-Up: Call back or seek an in-person evaluation if the symptoms worsen or if the condition fails to improve as anticipated.  [facility] [phone]  Other Instructions You are positive for covid. Start paxlovid today; take 3 tabs twice daily for 5 days. Monitor for adverse reactions, most commonly nausea, vomiting, diarrhea or headache. If these occur, you may stop the medication. Rest and stay hydrated with water. Please monitor regression of your symptoms.  Some people have tried OTC Quercetin to help fight off illness. OTC oscillococcinum can help with body aches.  If any new or worsening symptoms develops, particularly uncontrollable fever, severe shortness of breath or chest pain, please head to the ER.    If you have been instructed to have an in-person evaluation today at a local Urgent Care facility, please use the link below. It will take you to a list of all of our available [facility], including address, phone number and hours of operation. Please do not delay care.  [facility]  If you or a family member do not have a  primary care provider, use the link below to schedule a visit and establish care. When you choose a [facility] primary care physician or advanced practice provider, you gain a long-term partner in health. Find a Primary Care Provider  Learn more about [facility]'s in-office and virtual care options: [facility] Get Care Now

## 2024-02-16 NOTE — Progress Notes (Signed)
 Virtual Visit Consent   [name], you are scheduled for a virtual visit with a [facility] provider today. Just as with appointments in the office, your consent must be obtained to participate. Your consent will be active for this visit and any virtual visit you may have with one of our providers in the next 365 days. If you have a MyChart account, a copy of this consent can be sent to you electronically.  As this is a virtual visit, video technology does not allow for your provider to perform a traditional examination. This may limit your provider's ability to fully assess your condition. If your provider identifies any concerns that need to be evaluated in person or the need to arrange testing (such as labs, EKG, etc.), we will make arrangements to do so. Although advances in technology are sophisticated, we cannot ensure that it will always work on either your end or our end. If the connection with a video visit is poor, the visit may have to be switched to a telephone visit. With either a video or telephone visit, we are not always able to ensure that we have a secure connection.  By engaging in this virtual visit, you consent to the provision of healthcare and authorize for your insurance to be billed (if applicable) for the services provided during this visit. Depending on your insurance coverage, you may receive a charge related to this service.  I need to obtain your verbal consent now. Are you willing to proceed with your visit today? Michelle Kline has provided verbal consent on 02/16/2024 for a virtual visit (video or telephone). Michelle Kline, [place]  Date: 02/16/2024 6:49 PM   Virtual Visit via Video Note   I, Michelle Kline, connected with  Khala Tarte  ([id number], July 31, 1976) on 02/16/24 at  6:45 PM EDT by a video-enabled telemedicine application and verified that I am speaking with the correct person using two identifiers.  Location: Patient: Virtual Visit Location Patient:  Home Provider: Virtual Visit Location Provider: Home Office   I discussed the limitations of evaluation and management by telemedicine and the availability of in person appointments. The patient expressed understanding and agreed to proceed.    History of Present Illness: Michelle Kline is a 47 y.o. who identifies as a female who was assigned female at birth, and is being seen today for covid.  HPI: HPI   48yo female with hx of HTN presents for positive covid test. Started having symptoms of cough, chills, headache, nasal congestion yesterday. Has had covid in the past and tolerated paxlovid well. No hx of CKD or liver disease. Last GFR WNL. Pt denies CP/SOB.  Problems:  Patient Active Problem List   Diagnosis Date Noted   Iron deficiency anemia due to chronic blood loss 03/26/2021   Hyperaldosteronism (HCC) 02/25/2021   Class 3 severe obesity due to excess calories with serious comorbidity and body mass index (BMI) of 40.0 to 44.9 in adult [facility]) 02/11/2021   Encounter for general adult medical examination with abnormal findings 09/11/2016   Essential hypertension 01/13/2007    Allergies: No Known Allergies Medications:  Current Outpatient Medications:    nirmatrelvir/ritonavir (PAXLOVID) 20 x 150 MG & 10 x 100MG  TABS, Take 3 tablets by mouth 2 (two) times daily for 5 days. (Take nirmatrelvir 150 mg two tablets twice daily for 5 days and ritonavir 100 mg one tablet twice daily for 5 days) Patient GFR is 74, Disp: 30 tablet, Rfl: 0   amLODipine (NORVASC) 10  MG tablet, Take 1 tablet (10 mg total) by mouth daily., Disp: 90 tablet, Rfl: 1   Ferric Maltol (ACCRUFER) 30 MG CAPS, Take 1 capsule (30 mg total) by mouth in the morning and at bedtime., Disp: 180 capsule, Rfl: 0   methylPREDNISolone (MEDROL DOSEPAK) 4 MG TBPK tablet, Take as directed, Disp: 1 each, Rfl: 0   spironolactone (ALDACTONE) 25 MG tablet, Take 1 tablet (25 mg total) by mouth daily. Schedule an appt for further refills, Disp: 60  tablet, Rfl: 0   WEGOVY 0.25 MG/0.5ML SOAJ, Inject 0.25 mg into the skin every 7 (seven) days at 9 am, Disp: 2 mL, Rfl: 0   WEGOVY 1 MG/0.5ML SOAJ, Inject 1 mg into the skin every 7 (seven) days., Disp: 2 mL, Rfl: 0  Observations/Objective: Patient is well-developed, well-nourished in no acute distress.  Resting comfortably at home.  Head is normocephalic, atraumatic.  No labored breathing. No cough during encounter. Speaking in complete sentences without breathlessness Speech is clear and coherent with logical content.  Patient is alert and oriented at baseline.    Assessment and Plan: 1. COVID-19 (Primary) - nirmatrelvir/ritonavir (PAXLOVID) 20 x 150 MG & 10 x 100MG  TABS; Take 3 tablets by mouth 2 (two) times daily for 5 days. (Take nirmatrelvir 150 mg two tablets twice daily for 5 days and ritonavir 100 mg one tablet twice daily for 5 days) Patient GFR is 74  Dispense: 30 tablet; Refill: 0  Pt within tx window for paxlovid, has risk factors. Has taken in the past and tolerated, most recent GFR WNL.  Follow Up Instructions: I discussed the assessment and treatment plan with the patient. The patient was provided an opportunity to ask questions and all were answered. The patient agreed with the plan and demonstrated an understanding of the instructions.  A copy of instructions were sent to the patient via MyChart unless otherwise noted below.    The patient was advised to call back or seek an in-person evaluation if the symptoms worsen or if the condition fails to improve as anticipated.    Michelle Bees, PA

## 2024-02-25 ENCOUNTER — Other Ambulatory Visit (HOSPITAL_BASED_OUTPATIENT_CLINIC_OR_DEPARTMENT_OTHER): Payer: Self-pay

## 2024-02-25 MED ORDER — SEMAGLUTIDE-WEIGHT MANAGEMENT 1.7 MG/0.75ML ~~LOC~~ SOAJ
1.7000 mg | SUBCUTANEOUS | 0 refills | Status: AC
  Filled 2024-02-25: qty 3, 28d supply, fill #0

## 2024-04-09 ENCOUNTER — Other Ambulatory Visit: Payer: Self-pay | Admitting: Internal Medicine

## 2024-04-09 DIAGNOSIS — I10 Essential (primary) hypertension: Secondary | ICD-10-CM

## 2024-04-10 ENCOUNTER — Other Ambulatory Visit: Payer: Self-pay | Admitting: Internal Medicine

## 2024-04-10 DIAGNOSIS — E269 Hyperaldosteronism, unspecified: Secondary | ICD-10-CM

## 2024-04-10 DIAGNOSIS — I10 Essential (primary) hypertension: Secondary | ICD-10-CM

## 2024-12-11 ENCOUNTER — Other Ambulatory Visit (HOSPITAL_BASED_OUTPATIENT_CLINIC_OR_DEPARTMENT_OTHER): Payer: Self-pay

## 2024-12-11 MED ORDER — WEGOVY 0.25 MG/0.5ML ~~LOC~~ SOAJ
0.2500 mg | SUBCUTANEOUS | 1 refills | Status: AC
  Filled 2024-12-11: qty 2, 28d supply, fill #0

## 2024-12-12 ENCOUNTER — Other Ambulatory Visit (HOSPITAL_BASED_OUTPATIENT_CLINIC_OR_DEPARTMENT_OTHER): Payer: Self-pay
